# Patient Record
Sex: Female | Born: 1965 | Race: White | Hispanic: No | Smoking: Never smoker
Health system: Southern US, Community
[De-identification: ages and names within clinical notes are randomized; demographics above are authoritative.]

## PROBLEM LIST (undated history)

## (undated) DIAGNOSIS — N920 Excessive and frequent menstruation with regular cycle: Secondary | ICD-10-CM

## (undated) DIAGNOSIS — D171 Benign lipomatous neoplasm of skin and subcutaneous tissue of trunk: Secondary | ICD-10-CM

## (undated) DIAGNOSIS — R011 Cardiac murmur, unspecified: Secondary | ICD-10-CM

## (undated) DIAGNOSIS — R809 Proteinuria, unspecified: Secondary | ICD-10-CM

## (undated) DIAGNOSIS — D649 Anemia, unspecified: Secondary | ICD-10-CM

## (undated) DIAGNOSIS — B001 Herpesviral vesicular dermatitis: Secondary | ICD-10-CM

## (undated) DIAGNOSIS — I4949 Other premature depolarization: Secondary | ICD-10-CM

## (undated) DIAGNOSIS — E785 Hyperlipidemia, unspecified: Secondary | ICD-10-CM

## (undated) DIAGNOSIS — E559 Vitamin D deficiency, unspecified: Secondary | ICD-10-CM

## (undated) DIAGNOSIS — E119 Type 2 diabetes mellitus without complications: Secondary | ICD-10-CM

## (undated) DIAGNOSIS — J45909 Unspecified asthma, uncomplicated: Secondary | ICD-10-CM

## (undated) DIAGNOSIS — T7840XA Allergy, unspecified, initial encounter: Secondary | ICD-10-CM

## (undated) DIAGNOSIS — I428 Other cardiomyopathies: Secondary | ICD-10-CM

## (undated) HISTORY — PX: NASAL POLYP SURGERY: SHX186

## (undated) HISTORY — DX: Proteinuria, unspecified: R80.9

## (undated) HISTORY — DX: Hyperlipidemia, unspecified: E78.5

## (undated) HISTORY — DX: Benign lipomatous neoplasm of skin and subcutaneous tissue of trunk: D17.1

## (undated) HISTORY — DX: Anemia, unspecified: D64.9

## (undated) HISTORY — DX: Vitamin D deficiency, unspecified: E55.9

## (undated) HISTORY — DX: Herpesviral vesicular dermatitis: B00.1

## (undated) HISTORY — DX: Unspecified asthma, uncomplicated: J45.909

## (undated) HISTORY — PX: WISDOM TOOTH EXTRACTION: SHX21

## (undated) HISTORY — DX: Other premature depolarization: I49.49

## (undated) HISTORY — DX: Other cardiomyopathies: I42.8

## (undated) HISTORY — DX: Cardiac murmur, unspecified: R01.1

## (undated) HISTORY — DX: Excessive and frequent menstruation with regular cycle: N92.0

## (undated) HISTORY — DX: Type 2 diabetes mellitus without complications: E11.9

## (undated) HISTORY — DX: Allergy, unspecified, initial encounter: T78.40XA

---

## 1992-05-16 HISTORY — PX: OTHER SURGICAL HISTORY: SHX169

## 1992-05-16 HISTORY — PX: NASAL POLYP SURGERY: SHX186

## 2004-06-20 ENCOUNTER — Emergency Department (HOSPITAL_COMMUNITY): Admission: EM | Admit: 2004-06-20 | Discharge: 2004-06-20 | Payer: Self-pay | Admitting: Emergency Medicine

## 2004-10-11 ENCOUNTER — Emergency Department: Payer: Self-pay | Admitting: Emergency Medicine

## 2004-10-11 ENCOUNTER — Other Ambulatory Visit: Payer: Self-pay

## 2004-10-27 ENCOUNTER — Ambulatory Visit: Payer: Self-pay | Admitting: Internal Medicine

## 2004-12-30 ENCOUNTER — Ambulatory Visit: Payer: Self-pay | Admitting: Endocrinology

## 2006-08-17 IMAGING — CT CT ABDOMEN WO/W CM
2 of 4 series · 14 of 32 positions shown, 19 images · non-contrast
Comparison: none

REASON FOR EXAM: palpitations and heart arrhythmia   eval adrenals  (recent
CXR was normal)
COMMENTS:

[Series 4: with · axial · 0.67mm/px · z∈[-396,-172]mm · 8 of 37 slices shown, 13 images]
[im 5/37  soft-tissue]
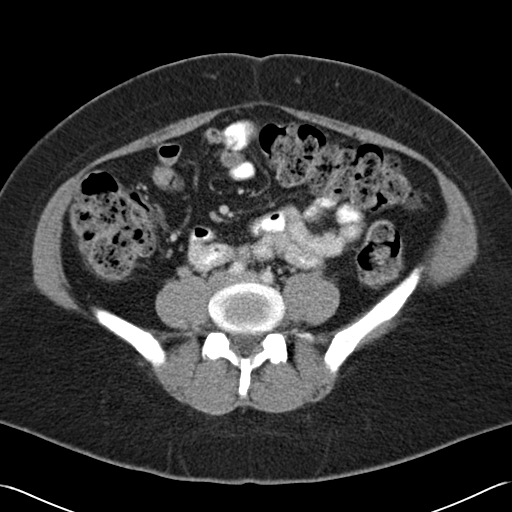
[im 5/37  bone]
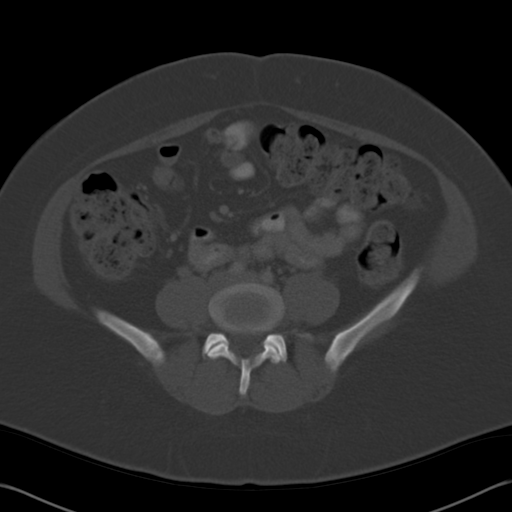
[im 9/37  soft-tissue]
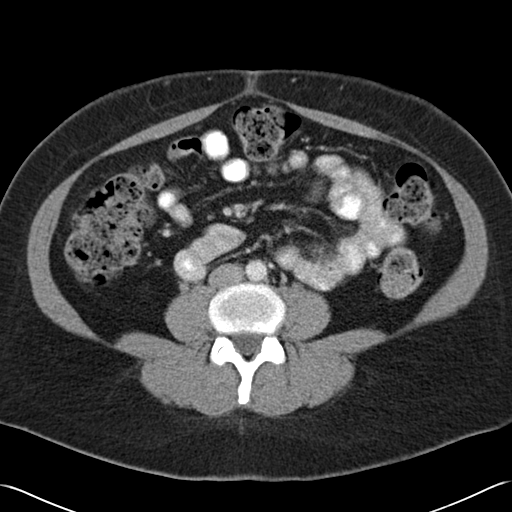
[im 13/37  soft-tissue]
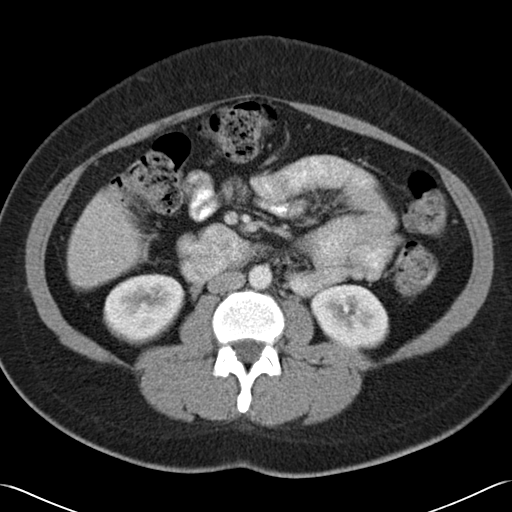
[im 17/37  soft-tissue]
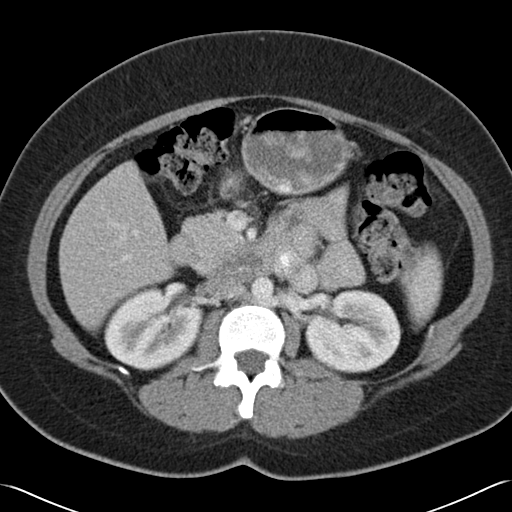
[im 21/37  soft-tissue]
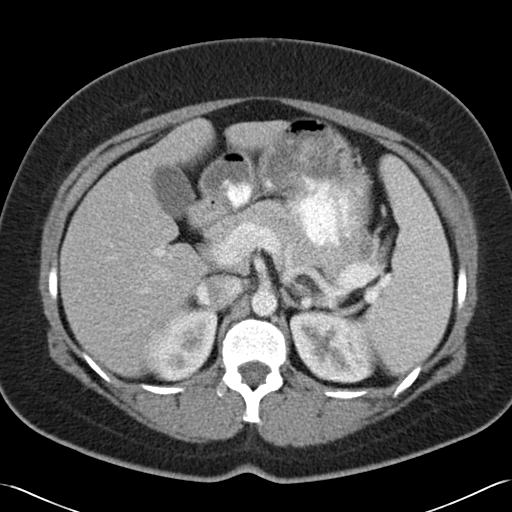
[im 21/37  lung]
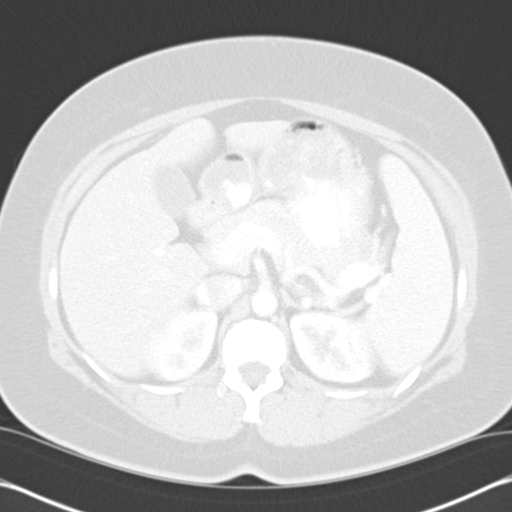
[im 25/37  soft-tissue]
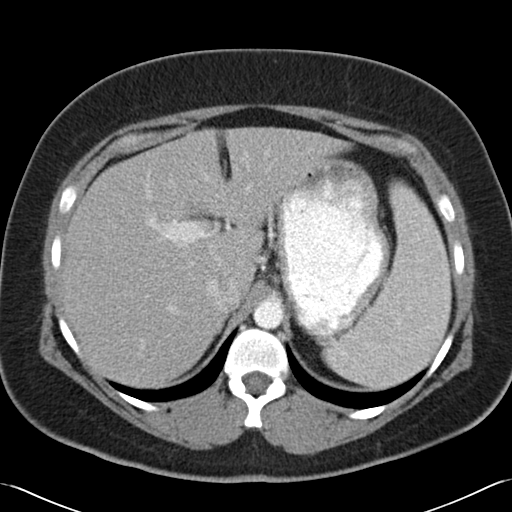
[im 25/37  lung]
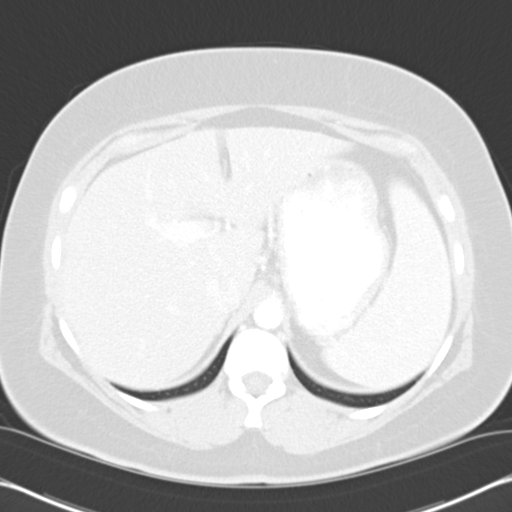
[im 29/37  soft-tissue]
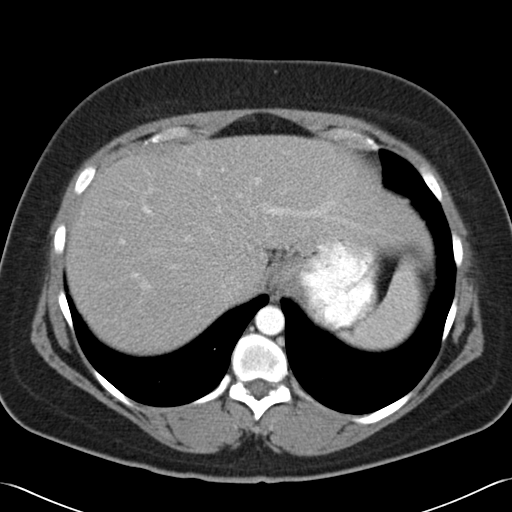
[im 29/37  lung]
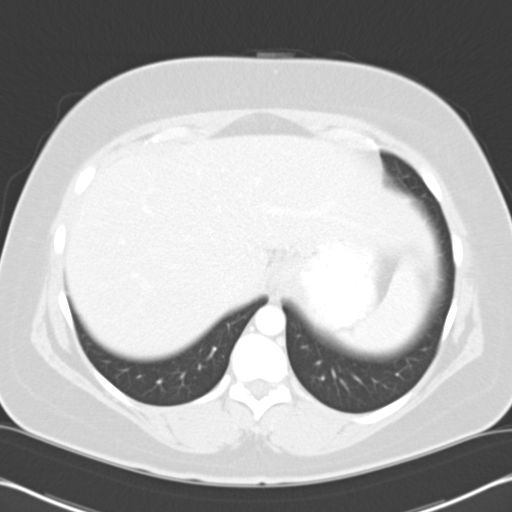
[im 33/37  soft-tissue]
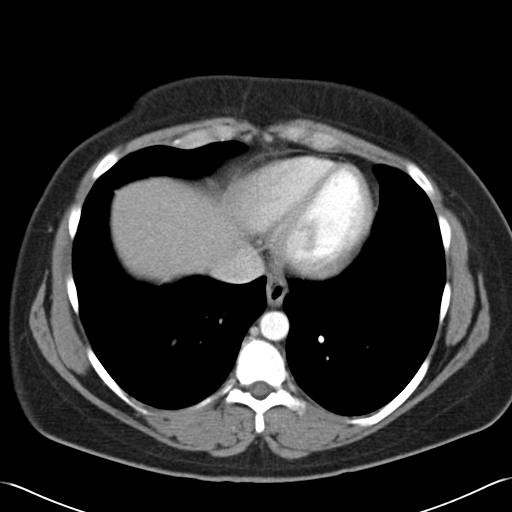
[im 33/37  lung]
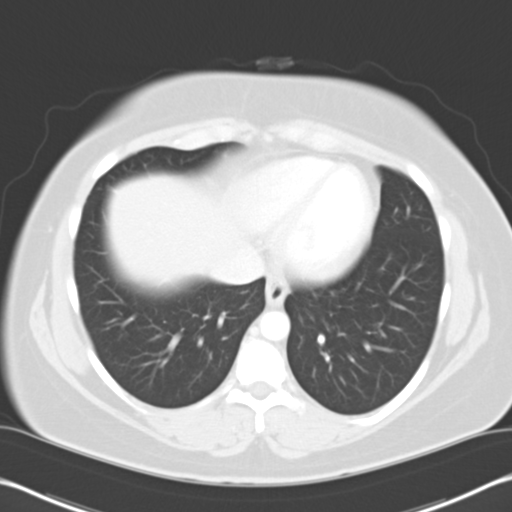

[Series 6: delay · axial · delayed · 0.67mm/px · z∈[-396,-236]mm · 6 of 37 slices shown]
[im 5/37  soft-tissue]
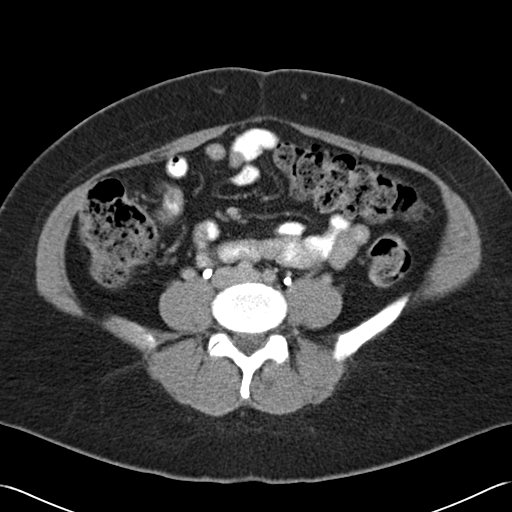
[im 9/37  soft-tissue]
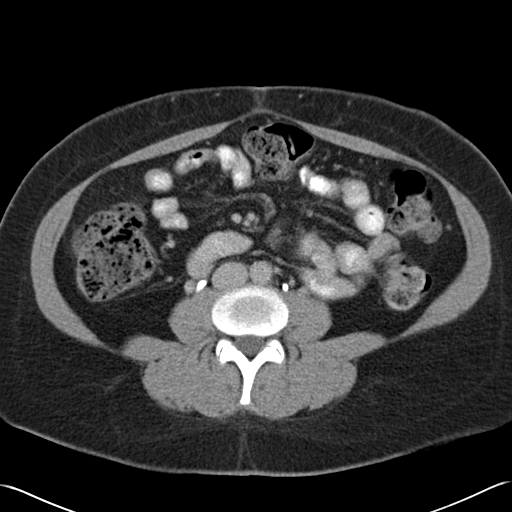
[im 13/37  soft-tissue]
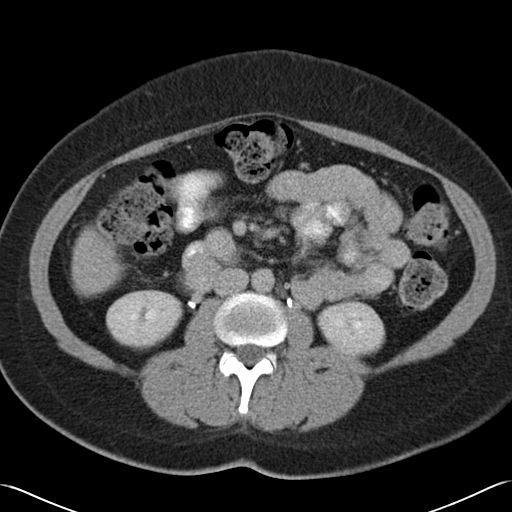
[im 17/37  soft-tissue]
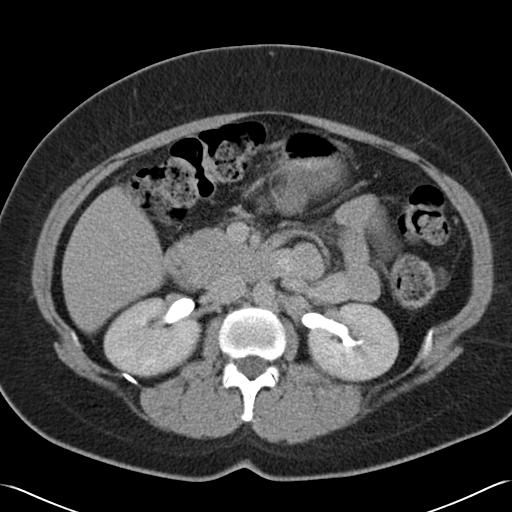
[im 21/37  soft-tissue]
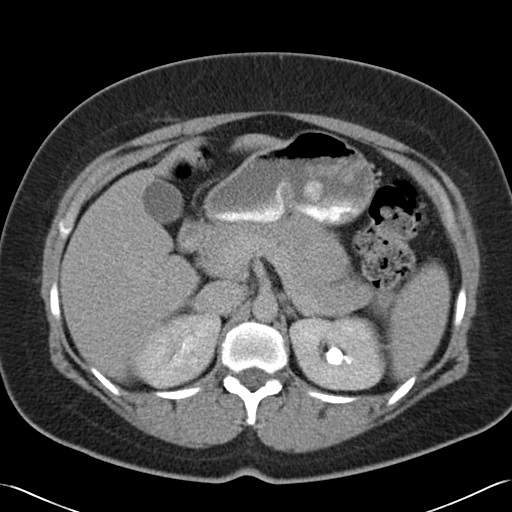
[im 25/37  soft-tissue]
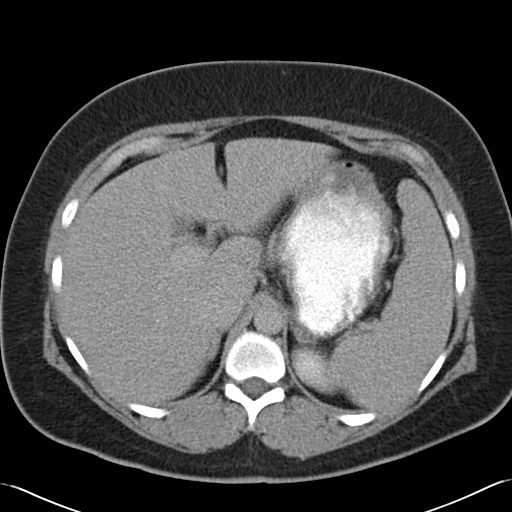

[14 of 32 positions shown; findings below may reference images not displayed]

PROCEDURE:     CT  - CT ABDOMEN STANDARD W/WO  - October 27, 2004  [DATE]

RESULT:        Triphasic CT scan of the abdomen shows a normal appearance of
the liver, spleen, pancreas, kidneys, gallbladder, aorta and adrenal glands.
 No abnormal fluid collections, free fluid or free air are seen.  The
kidneys appear to enhance normally.  No renal calculi are evident on the
precontrast images. No obstructive changes are noted on the delayed images.
No radiopaque gallstones are demonstrated.
IMPRESSION: Unremarkable appearance of the abdomen.   No evidence of a focal adrenal
mass.  No evidence of adenopathy.

## 2006-08-28 DIAGNOSIS — I429 Cardiomyopathy, unspecified: Secondary | ICD-10-CM

## 2006-08-28 DIAGNOSIS — I428 Other cardiomyopathies: Secondary | ICD-10-CM

## 2006-08-28 HISTORY — DX: Other cardiomyopathies: I42.8

## 2006-08-28 HISTORY — DX: Cardiomyopathy, unspecified: I42.9

## 2009-07-29 ENCOUNTER — Ambulatory Visit: Payer: Self-pay | Admitting: Unknown Physician Specialty

## 2010-08-11 ENCOUNTER — Ambulatory Visit: Payer: Self-pay | Admitting: Family Medicine

## 2011-08-25 ENCOUNTER — Ambulatory Visit: Payer: Self-pay | Admitting: Family Medicine

## 2012-06-03 ENCOUNTER — Other Ambulatory Visit: Payer: Self-pay | Admitting: Family Medicine

## 2012-06-03 NOTE — Telephone Encounter (Signed)
Please pull paper chart.  

## 2012-06-04 NOTE — Telephone Encounter (Signed)
No [facility] account. May be previous [place] patient of Dr. Michaelle Copas.

## 2012-08-08 ENCOUNTER — Encounter: Payer: Self-pay | Admitting: Family Medicine

## 2012-08-08 ENCOUNTER — Ambulatory Visit (INDEPENDENT_AMBULATORY_CARE_PROVIDER_SITE_OTHER): Payer: BC Managed Care – PPO | Admitting: Family Medicine

## 2012-08-08 VITALS — BP 118/68 | HR 67 | Temp 98.2°F | Resp 16 | Ht 63.0 in | Wt 160.0 lb

## 2012-08-08 DIAGNOSIS — E7800 Pure hypercholesterolemia, unspecified: Secondary | ICD-10-CM

## 2012-08-08 DIAGNOSIS — E119 Type 2 diabetes mellitus without complications: Secondary | ICD-10-CM | POA: Insufficient documentation

## 2012-08-08 DIAGNOSIS — D509 Iron deficiency anemia, unspecified: Secondary | ICD-10-CM | POA: Insufficient documentation

## 2012-08-08 DIAGNOSIS — I493 Ventricular premature depolarization: Secondary | ICD-10-CM | POA: Insufficient documentation

## 2012-08-08 DIAGNOSIS — E1121 Type 2 diabetes mellitus with diabetic nephropathy: Secondary | ICD-10-CM | POA: Insufficient documentation

## 2012-08-08 DIAGNOSIS — I10 Essential (primary) hypertension: Secondary | ICD-10-CM | POA: Insufficient documentation

## 2012-08-08 DIAGNOSIS — J309 Allergic rhinitis, unspecified: Secondary | ICD-10-CM | POA: Insufficient documentation

## 2012-08-08 DIAGNOSIS — J45909 Unspecified asthma, uncomplicated: Secondary | ICD-10-CM | POA: Insufficient documentation

## 2012-08-08 LAB — CBC WITH DIFFERENTIAL/PLATELET
Basophils Absolute: 0 10*3/uL (ref 0.0–0.1)
Basophils Relative: 1 % (ref 0–1)
Hemoglobin: 9.6 g/dL — ABNORMAL LOW (ref 12.0–15.0)
MCHC: 31.9 g/dL (ref 30.0–36.0)
Neutro Abs: 2.8 10*3/uL (ref 1.7–7.7)
Neutrophils Relative %: 44 % (ref 43–77)
Platelets: 277 10*3/uL (ref 150–400)
RDW: 17.3 % — ABNORMAL HIGH (ref 11.5–15.5)

## 2012-08-08 LAB — COMPREHENSIVE METABOLIC PANEL
ALT: 16 U/L (ref 0–35)
AST: 16 U/L (ref 0–37)
Alkaline Phosphatase: 50 U/L (ref 39–117)
Glucose, Bld: 99 mg/dL (ref 70–99)
Sodium: 137 mEq/L (ref 135–145)
Total Bilirubin: 0.5 mg/dL (ref 0.3–1.2)
Total Protein: 6.9 g/dL (ref 6.0–8.3)

## 2012-08-08 LAB — IRON AND TIBC: UIBC: 376 ug/dL (ref 125–400)

## 2012-08-08 LAB — LIPID PANEL
Cholesterol: 175 mg/dL (ref 0–200)
LDL Cholesterol: 118 mg/dL — ABNORMAL HIGH (ref 0–99)
Total CHOL/HDL Ratio: 4.9 Ratio
VLDL: 21 mg/dL (ref 0–40)

## 2012-08-08 MED ORDER — ALBUTEROL SULFATE HFA 108 (90 BASE) MCG/ACT IN AERS: 2.0000 | INHALATION_SPRAY | Freq: Four times a day (QID) | RESPIRATORY_TRACT | Status: DC | PRN

## 2012-08-08 MED ORDER — PRAVASTATIN SODIUM 40 MG PO TABS: 40.0000 mg | ORAL_TABLET | Freq: Every day | ORAL | Status: DC

## 2012-08-08 MED ORDER — LOSARTAN POTASSIUM 25 MG PO TABS: 25.0000 mg | ORAL_TABLET | Freq: Every day | ORAL | Status: DC

## 2012-08-08 MED ORDER — CARVEDILOL 3.125 MG PO TABS: 3.1250 mg | ORAL_TABLET | Freq: Two times a day (BID) | ORAL | Status: DC

## 2012-08-08 MED ORDER — SITAGLIPTIN PHOS-METFORMIN HCL 50-1000 MG PO TABS: 1.0000 | ORAL_TABLET | Freq: Two times a day (BID) | ORAL | Status: DC

## 2012-08-08 MED ORDER — GLIPIZIDE ER 5 MG PO TB24: 5.0000 mg | ORAL_TABLET | Freq: Every day | ORAL | Status: DC

## 2012-08-08 MED ORDER — FLUTICASONE FUROATE 27.5 MCG/SPRAY NA SUSP: 2.0000 | Freq: Every day | NASAL | Status: DC

## 2012-08-08 NOTE — Assessment & Plan Note (Signed)
Controlled; with fifteen pound weight loss in past three months; congratulations; has suffered with three episodes of hypoglycemia with recent weight loss and dietary modification; obtain labs.

## 2012-08-08 NOTE — Progress Notes (Signed)
[address]   [place], [place]  [address]   [phone]  Subjective:    Patient ID: Barbara Henson, female    DOB: 04/18/1966, 47 y.o.   MRN: [id number]  HPI This 47 y.o. female presents to establish care and for follow-up:  1.  DMII:  Eight month follow-up; exercising 30-46 minutes per day; does before goes to bed.  Has three pound weights; work also has gym [employer].  Also dieting; has lost 15 pounds in past three months.  Multiple deaths in family; cousin died of AMI; going on vacation this summer and plans to walk [place].  Sugars running much better; having lows at night; shaking.  Three episodes in past two months.  Fasting sugars sporadically; running 100-110.  No meds for one week; left medications at home while on work trip.    2. Hyperlipidemia:  Eight month follow-up; no changes to management made at last visit; reports good compliance with medication; good tolerance to medication; good symptom control. Denies HA, dizziness, focal weakness, paresthesias.  3.  Cardiomyopathy with PVCs:  Stable; intermittent issue.  Using extra carvedilol PRN PVCs/palpitations.  Not checking blood pressure at home.  Compliance with medication; good tolerance to medication; good symptom control.  No dizziness or symptoms of low blood pressure.  4.  Anemia iron deficiency: eight month follow-up.  Got Mirena after last visit due to menorrhagia; still having menses but significantly lighter; monthly menses.  After insertion of Mirena, bled for three months straight.  No dizziness.  No fatigue.  Thinks that anemia much improved.  5.  Allergic rhinitis: stable; taking Zyrtec and Veramyst PRN at this time.  No significant rhinorrhea, nasal congestion, itchy eyes or nose.    6.  Asthma:  Must use Albuterol during exercise thus using more frequently than baseline.    Review of Systems  Constitutional: Negative for fever, chills, diaphoresis and fatigue.  Respiratory: Negative for cough, choking, chest  tightness, shortness of breath, wheezing and stridor.   Cardiovascular: Positive for palpitations. Negative for chest pain and leg swelling.  Endocrine: Negative for cold intolerance, heat intolerance, polydipsia, polyphagia and polyuria.  Genitourinary: Positive for menstrual problem.  Skin: Negative for color change, pallor, rash and wound.  Neurological: Negative for dizziness, tremors, seizures, syncope, facial asymmetry, speech difficulty, weakness, light-headedness, numbness and headaches.        Past Medical History  Diagnosis Date  . Asthma   . Allergy   . Anemia   . Diabetes mellitus without complication   . Hyperlipidemia     Past Surgical History  Procedure Laterality Date  . Cesarean section    . Wisdom tooth extraction    . Nasal polypectomy      Prior to Admission medications   Medication Sig Start Date End Date Taking? Authorizing Provider  albuterol (PROVENTIL HFA;VENTOLIN HFA) 108 (90 BASE) MCG/ACT inhaler Inhale 2 puffs into the lungs every 6 (six) hours as needed for wheezing. 08/08/12  Yes Ethelda Chick, MD  carvedilol (COREG) 3.125 MG tablet Take 1 tablet (3.125 mg total) by mouth 2 (two) times daily with a meal. 08/08/12  Yes Ethelda Chick, MD  cetirizine (ZYRTEC) 10 MG tablet Take 10 mg by mouth daily.   Yes Historical Provider, MD  fish oil-omega-3 fatty acids 1000 MG capsule Take 2 g by mouth daily.   Yes Historical Provider, MD  fluticasone (VERAMYST) 27.5 MCG/SPRAY nasal spray Place 2 sprays into the nose daily. 08/08/12  Yes Ethelda Chick,  MD  glipiZIDE (GLUCOTROL XL) 5 MG 24 hr tablet Take 1 tablet (5 mg total) by mouth daily. 08/08/12  Yes Ethelda Chick, MD  losartan (COZAAR) 25 MG tablet Take 1 tablet (25 mg total) by mouth daily. 08/08/12  Yes Ethelda Chick, MD  pravastatin (PRAVACHOL) 40 MG tablet Take 1 tablet (40 mg total) by mouth daily. 08/08/12  Yes Ethelda Chick, MD  sitaGLIPtan-metformin (JANUMET) 50-1000 MG per tablet Take 1 tablet by mouth  2 (two) times daily with a meal. 08/08/12  Yes Ethelda Chick, MD    Allergies  Allergen Reactions  . Celebrex (Celecoxib)   . Lisinopril     History   Social History  . Marital Status: Married    Spouse Name: N/A    Number of Children: N/A  . Years of Education: N/A   Occupational History  . accounting    Social History Main Topics  . Smoking status: Never Smoker   . Smokeless tobacco: Never Used  . Alcohol Use: Yes  . Drug Use: No  . Sexually Active: Yes -- Female partner(s)    Birth Control/ Protection: Other-see comments     Comment: 1 partner   Other Topics Concern  . Not on file   Social History Narrative  . No narrative on file    Family History  Problem Relation Age of Onset  . Adopted: Yes  . Family history unknown: Yes    Objective:   Physical Exam  Nursing note and vitals reviewed. Constitutional: She is oriented to person, place, and time. She appears well-developed and well-nourished. No distress.  HENT:  Mouth/Throat: Oropharynx is clear and moist.  Eyes: Conjunctivae and EOM are normal. Pupils are equal, round, and reactive to light.  Neck: Normal range of motion. Neck supple. No JVD present. No thyromegaly present.  Cardiovascular: Normal rate and regular rhythm.  Exam reveals no gallop and no friction rub.   Murmur heard.  Systolic murmur is present with a grade of 2/6  Pulmonary/Chest: Effort normal and breath sounds normal. No respiratory distress. She has no wheezes. She has no rales.  Abdominal: Soft. Bowel sounds are normal. She exhibits no distension. There is no tenderness. There is no rebound and no guarding.  Lymphadenopathy:    She has no cervical adenopathy.  Neurological: She is alert and oriented to person, place, and time. No cranial nerve deficit. She exhibits normal muscle tone. Coordination normal.  Skin: Skin is warm and dry. No rash noted. She is not diaphoretic. No pallor.  Psychiatric: She has a normal mood and affect. Her  behavior is normal.        Assessment & Plan:  Type II or unspecified type diabetes mellitus without mention of complication, not stated as uncontrolled - Plan: CBC with Differential, Comprehensive metabolic panel, POCT glycosylated hemoglobin (Hb A1C), sitaGLIPtan-metformin (JANUMET) 50-1000 MG per tablet, losartan (COZAAR) 25 MG tablet, glipiZIDE (GLUCOTROL XL) 5 MG 24 hr tablet, carvedilol (COREG) 3.125 MG tablet  Essential hypertension, benign - Plan: CBC with Differential, Comprehensive metabolic panel, CK  Iron deficiency anemia, unspecified - Plan: Iron and TIBC  Pure hypercholesterolemia - Plan: Lipid panel, pravastatin (PRAVACHOL) 40 MG tablet  Allergic rhinitis - Plan: fluticasone (VERAMYST) 27.5 MCG/SPRAY nasal spray  Extrinsic asthma, unspecified - Plan: albuterol (PROVENTIL HFA;VENTOLIN HFA) 108 (90 BASE) MCG/ACT inhaler  Meds ordered this encounter  Medications  . cetirizine (ZYRTEC) 10 MG tablet    Sig: Take 10 mg by mouth daily.  Marland Kitchen DISCONTD:  fluticasone (VERAMYST) 27.5 MCG/SPRAY nasal spray    Sig: Place 2 sprays into the nose daily.  Marland Kitchen DISCONTD: sitaGLIPtan-metformin (JANUMET) 50-1000 MG per tablet    Sig: Take 1 tablet by mouth 2 (two) times daily with a meal.  . DISCONTD: albuterol (PROVENTIL HFA;VENTOLIN HFA) 108 (90 BASE) MCG/ACT inhaler    Sig: Inhale 2 puffs into the lungs every 6 (six) hours as needed for wheezing.  Marland Kitchen DISCONTD: glipiZIDE (GLUCOTROL XL) 5 MG 24 hr tablet    Sig: Take 5 mg by mouth daily.  Marland Kitchen DISCONTD: carvedilol (COREG) 3.125 MG tablet    Sig: Take 3.125 mg by mouth 2 (two) times daily with a meal.  . DISCONTD: losartan (COZAAR) 25 MG tablet    Sig: Take 25 mg by mouth daily.  Marland Kitchen DISCONTD: pravastatin (PRAVACHOL) 40 MG tablet    Sig: Take 40 mg by mouth daily.  . fish oil-omega-3 fatty acids 1000 MG capsule    Sig: Take 2 g by mouth daily.  . sitaGLIPtan-metformin (JANUMET) 50-1000 MG per tablet    Sig: Take 1 tablet by mouth 2 (two) times  daily with a meal.    Dispense:  60 tablet    Refill:  5  . pravastatin (PRAVACHOL) 40 MG tablet    Sig: Take 1 tablet (40 mg total) by mouth daily.    Dispense:  30 tablet    Refill:  5  . losartan (COZAAR) 25 MG tablet    Sig: Take 1 tablet (25 mg total) by mouth daily.    Dispense:  30 tablet    Refill:  5  . glipiZIDE (GLUCOTROL XL) 5 MG 24 hr tablet    Sig: Take 1 tablet (5 mg total) by mouth daily.    Dispense:  30 tablet    Refill:  5  . fluticasone (VERAMYST) 27.5 MCG/SPRAY nasal spray    Sig: Place 2 sprays into the nose daily.    Dispense:  10 g    Refill:  11  . carvedilol (COREG) 3.125 MG tablet    Sig: Take 1 tablet (3.125 mg total) by mouth 2 (two) times daily with a meal.    Dispense:  60 tablet    Refill:  5  . albuterol (PROVENTIL HFA;VENTOLIN HFA) 108 (90 BASE) MCG/ACT inhaler    Sig: Inhale 2 puffs into the lungs every 6 (six) hours as needed for wheezing.    Dispense:  18 g    Refill:  1

## 2012-08-08 NOTE — Assessment & Plan Note (Signed)
Stable; refill provided.

## 2012-08-08 NOTE — Assessment & Plan Note (Signed)
Controlled.  No change in medications; obtain labs; refills provided.

## 2012-08-08 NOTE — Assessment & Plan Note (Signed)
Controlled; refills provided. 

## 2012-08-08 NOTE — Assessment & Plan Note (Signed)
Uncontrolled. Obtain labs; asymptomatic.

## 2012-08-08 NOTE — Assessment & Plan Note (Signed)
Stable; refill of Coreg provided.

## 2012-08-08 NOTE — Patient Instructions (Addendum)
Type II or unspecified type diabetes mellitus without mention of complication, not stated as uncontrolled - Plan: CBC with Differential, Comprehensive metabolic panel, POCT glycosylated hemoglobin (Hb A1C), sitaGLIPtan-metformin (JANUMET) 50-1000 MG per tablet, losartan (COZAAR) 25 MG tablet, glipiZIDE (GLUCOTROL XL) 5 MG 24 hr tablet, carvedilol (COREG) 3.125 MG tablet  Essential hypertension, benign - Plan: CBC with Differential, Comprehensive metabolic panel, CK  Iron deficiency anemia, unspecified - Plan: Iron and TIBC  Pure hypercholesterolemia - Plan: Lipid panel, pravastatin (PRAVACHOL) 40 MG tablet  Allergic rhinitis - Plan: fluticasone (VERAMYST) 27.5 MCG/SPRAY nasal spray  Extrinsic asthma, unspecified - Plan: albuterol (PROVENTIL HFA;VENTOLIN HFA) 108 (90 BASE) MCG/ACT inhaler

## 2012-08-08 NOTE — Assessment & Plan Note (Signed)
Controlled; obtain labs; refills provided. 

## 2012-08-08 NOTE — Assessment & Plan Note (Signed)
Controlled; refill of Albuterol provided.

## 2012-08-16 ENCOUNTER — Encounter: Payer: Self-pay | Admitting: Family Medicine

## 2012-08-28 ENCOUNTER — Ambulatory Visit: Payer: Self-pay | Admitting: Unknown Physician Specialty

## 2012-10-16 ENCOUNTER — Encounter: Payer: Self-pay | Admitting: *Deleted

## 2012-12-04 ENCOUNTER — Encounter: Payer: Self-pay | Admitting: Family Medicine

## 2012-12-04 ENCOUNTER — Ambulatory Visit (INDEPENDENT_AMBULATORY_CARE_PROVIDER_SITE_OTHER): Payer: BC Managed Care – PPO | Admitting: Family Medicine

## 2012-12-04 VITALS — BP 100/60 | HR 63 | Temp 98.6°F | Resp 16 | Ht 62.75 in | Wt 146.0 lb

## 2012-12-04 DIAGNOSIS — I493 Ventricular premature depolarization: Secondary | ICD-10-CM

## 2012-12-04 DIAGNOSIS — D509 Iron deficiency anemia, unspecified: Secondary | ICD-10-CM

## 2012-12-04 DIAGNOSIS — I4949 Other premature depolarization: Secondary | ICD-10-CM

## 2012-12-04 DIAGNOSIS — I10 Essential (primary) hypertension: Secondary | ICD-10-CM

## 2012-12-04 DIAGNOSIS — E7800 Pure hypercholesterolemia, unspecified: Secondary | ICD-10-CM

## 2012-12-04 DIAGNOSIS — E119 Type 2 diabetes mellitus without complications: Secondary | ICD-10-CM

## 2012-12-04 DIAGNOSIS — E1121 Type 2 diabetes mellitus with diabetic nephropathy: Secondary | ICD-10-CM

## 2012-12-04 LAB — CBC WITH DIFFERENTIAL/PLATELET
Basophils Absolute: 0 10*3/uL (ref 0.0–0.1)
Basophils Relative: 0 % (ref 0–1)
Eosinophils Absolute: 0.5 10*3/uL (ref 0.0–0.7)
Eosinophils Relative: 7 % — ABNORMAL HIGH (ref 0–5)
Lymphocytes Relative: 33 % (ref 12–46)
MCHC: 32.6 g/dL (ref 30.0–36.0)
MCV: 72.9 fL — ABNORMAL LOW (ref 78.0–100.0)
Platelets: 269 10*3/uL (ref 150–400)
RDW: 16.5 % — ABNORMAL HIGH (ref 11.5–15.5)
WBC: 7.3 10*3/uL (ref 4.0–10.5)

## 2012-12-04 LAB — POCT URINALYSIS DIPSTICK
Nitrite, UA: NEGATIVE
Spec Grav, UA: 1.025
Urobilinogen, UA: 0.2
pH, UA: 5.5

## 2012-12-04 LAB — LIPID PANEL
HDL: 46 mg/dL (ref >39)
LDL Cholesterol: 81 mg/dL (ref 0–99)
Total CHOL/HDL Ratio: 3.1 Ratio
Triglycerides: 87 mg/dL (ref <150)
VLDL: 17 mg/dL (ref 0–40)

## 2012-12-04 LAB — TSH: TSH: 2.099 u[IU]/mL (ref 0.350–4.500)

## 2012-12-04 LAB — COMPREHENSIVE METABOLIC PANEL
ALT: 12 U/L (ref 0–35)
AST: 13 U/L (ref 0–37)
Alkaline Phosphatase: 63 U/L (ref 39–117)
BUN: 8 mg/dL (ref 6–23)
Chloride: 104 mEq/L (ref 96–112)
Creat: 0.63 mg/dL (ref 0.50–1.10)
Total Bilirubin: 0.6 mg/dL (ref 0.3–1.2)

## 2012-12-04 LAB — CK: Total CK: 54 U/L (ref 7–177)

## 2012-12-04 NOTE — Progress Notes (Signed)
[address]   [place], [place]  [address]   [phone]  Subjective:    Patient ID: Barbara Henson, female    DOB: 1966-04-14, 47 y.o.   MRN: [id number]  HPI This 47 y.o. female presents for four month follow-up:  1.  DMII;  four month follow-up;  Treadmill and bike riding and weights with resistant bands; exercising 30 minutes per day; husband has lost 22 pounds; has met goal; at max 205 non-pregnant state three years ago.  Loose skin in lower abdomen.  Sugars running really good; 80s; continues to have lows.  Worried that blood pressure running low; bending over will cause dizziness.  Still taking Glipizide.    2.  Hyperlipidemia:  Four month follow-up; cholesterol levels slightly elevated at last visit; no changes to management made; weight down 14 pounds from last visit; reports compliance with medication, good tolerance to medication, good symptom control.  Denies CP/SOB/leg swelling/HA/focal weakness/paresthesias.  3. Anemia:  Mirena did not take away menses; went to the beach in April; had just had menses before beach trip; Mirena one year; last month had 8 day period;; heavy.  Frustrated by persistent menses.  4.  PVC: taking Coreg bid.  When first started exercising, really suffered with PVCs; now that has been exercising for six months, PVCs during exercise have resolved.   Review of Systems  Constitutional: Negative for fever, chills, diaphoresis and fatigue.  Respiratory: Negative for shortness of breath, wheezing and stridor.   Cardiovascular: Negative for chest pain, palpitations and leg swelling.  Gastrointestinal: Negative for nausea, vomiting, abdominal pain and diarrhea.  Genitourinary: Positive for menstrual problem.  Skin: Negative for color change, pallor, rash and wound.  Neurological: Positive for dizziness and light-headedness. Negative for tremors, seizures, syncope, facial asymmetry, speech difficulty, weakness, numbness and headaches.    Past Medical History    Diagnosis Date  . Asthma   . Allergy   . Anemia   . Diabetes mellitus without complication   . Hyperlipidemia   . Lipoma of abdominal wall   . Unspecified vitamin D deficiency   . Microalbuminuria   . Herpes labialis   . Cardiomyopathy, primary 08/28/2006    released from cardiology 2011  . Premature beats   . Excessive menstruation     Past Surgical History  Procedure Laterality Date  . Cesarean section  x 3    1998/2002/2004  . Wisdom tooth extraction    . Nasal polypectomy  1994  . Nasal polyp surgery      Prior to Admission medications   Medication Sig Start Date End Date Taking? Authorizing Provider  albuterol (PROVENTIL HFA;VENTOLIN HFA) 108 (90 BASE) MCG/ACT inhaler Inhale 2 puffs into the lungs every 6 (six) hours as needed for wheezing. 08/08/12  Yes Ethelda Chick, MD  carvedilol (COREG) 3.125 MG tablet Take 1 tablet (3.125 mg total) by mouth 2 (two) times daily with a meal. 08/08/12  Yes Ethelda Chick, MD  cetirizine (ZYRTEC) 10 MG tablet Take 10 mg by mouth daily.   Yes Historical Provider, MD  cholecalciferol (VITAMIN D) 1000 UNITS tablet Take 1,000 Units by mouth daily.   Yes Historical Provider, MD  ferrous sulfate (KP FERROUS SULFATE) 325 (65 FE) MG tablet Take 325 mg by mouth daily with breakfast.   Yes Historical Provider, MD  fish oil-omega-3 fatty acids 1000 MG capsule Take 2 g by mouth daily.   Yes Historical Provider, MD  fluticasone (FLONASE) 50 MCG/ACT nasal spray Place 2 sprays into  the nose daily.   Yes Historical Provider, MD  glipiZIDE (GLUCOTROL XL) 5 MG 24 hr tablet Take 1 tablet (5 mg total) by mouth daily. 08/08/12  Yes Ethelda Chick, MD  losartan (COZAAR) 25 MG tablet Take 1 tablet (25 mg total) by mouth daily. 08/08/12  Yes Ethelda Chick, MD  pravastatin (PRAVACHOL) 40 MG tablet Take 1 tablet (40 mg total) by mouth daily. 08/08/12  Yes Ethelda Chick, MD  sitaGLIPtan-metformin (JANUMET) 50-1000 MG per tablet Take 1 tablet by mouth 2 (two) times  daily with a meal. 08/08/12  Yes Ethelda Chick, MD  fluticasone (VERAMYST) 27.5 MCG/SPRAY nasal spray Place 2 sprays into the nose daily. 08/08/12   Ethelda Chick, MD    Allergies  Allergen Reactions  . Celebrex (Celecoxib)   . Lisinopril     History   Social History  . Marital Status: Married    Spouse Name: N/A    Number of Children: 5  . Years of Education: college   Occupational History  . accounting    Social History Main Topics  . Smoking status: Never Smoker   . Smokeless tobacco: Never Used  . Alcohol Use: Yes     Comment: moderate  . Drug Use: No  . Sexually Active: Yes -- Female partner(s)    Birth Control/ Protection: Other-see comments     Comment: 1 partner   Other Topics Concern  . Not on file   Social History Narrative   Patient was adopted; Family History unknown.   Exercise: Light;2 x week, cycling; her and her husband got bikes for christmas.    Family History  Problem Relation Age of Onset  . Adopted: Yes  . Allergies Daughter   . Allergies Son        Objective:   Physical Exam  Nursing note and vitals reviewed. Constitutional: She is oriented to person, place, and time. She appears well-developed and well-nourished. No distress.  HENT:  Head: Normocephalic and atraumatic.  Eyes: Conjunctivae and EOM are normal. Pupils are equal, round, and reactive to light.  Neck: Normal range of motion. Neck supple. No thyromegaly present.  Cardiovascular: Normal rate, regular rhythm and intact distal pulses.   Murmur heard.  Systolic murmur is present with a grade of 2/6  Pulmonary/Chest: Effort normal and breath sounds normal. She has no wheezes. She has no rales.  Abdominal: Soft. Bowel sounds are normal. She exhibits no distension and no mass. There is no tenderness. There is no rebound and no guarding.  Lymphadenopathy:    She has no cervical adenopathy.  Neurological: She is alert and oriented to person, place, and time. No cranial nerve deficit.  She exhibits normal muscle tone. Coordination normal.  Skin: Skin is warm and dry. No rash noted. She is not diaphoretic. No erythema.  Psychiatric: She has a normal mood and affect. Her behavior is normal.       Assessment & Plan:  Type II or unspecified type diabetes mellitus without mention of complication, not stated as uncontrolled - Plan: POCT glycosylated hemoglobin (Hb A1C), CK, Microalbumin, urine, TSH  Essential hypertension, benign - Plan: CBC with Differential, Comprehensive metabolic panel, CK, POCT urinalysis dipstick, TSH  Pure hypercholesterolemia - Plan: Lipid panel, CK  Iron deficiency anemia, unspecified  Microalbuminuric diabetic nephropathy  PVC (premature ventricular contraction)   1.  DMII: controlled; fourteen additional pounds of weight loss; likely can d/c Glipizide.  Obtain labs, urine microalbumin. 2.   Hyperlipidemia: moderately controlled; obtain  labs; hope to decrease dose.  Obtain labs.  No change to management. 3.  Iron deficiency anemia: persistent; secondary to menorrhagia; continue IUD; followed by gynecology.  4.  Diabetic nephropathy:  Stable; obtain microalbumin; blood pressure borderline low; would like to d/c Losartan if possible. 5.  PVCs; stable; less symptomatic with exercise.  No change in therapy.

## 2012-12-06 ENCOUNTER — Encounter: Payer: Self-pay | Admitting: Family Medicine

## 2012-12-06 NOTE — Telephone Encounter (Signed)
Can you change last name to Gaspar Garbe then forward to Dr Katrinka Blazing to advise on the message.

## 2012-12-11 ENCOUNTER — Encounter: Payer: Self-pay | Admitting: Family Medicine

## 2012-12-12 ENCOUNTER — Encounter: Payer: Self-pay | Admitting: Family Medicine

## 2013-03-03 ENCOUNTER — Other Ambulatory Visit: Payer: Self-pay | Admitting: Family Medicine

## 2013-03-06 ENCOUNTER — Other Ambulatory Visit: Payer: Self-pay | Admitting: Family Medicine

## 2013-03-21 ENCOUNTER — Other Ambulatory Visit: Payer: Self-pay

## 2013-04-25 ENCOUNTER — Telehealth: Payer: Self-pay | Admitting: *Deleted

## 2013-04-25 NOTE — Telephone Encounter (Signed)
Spoke to patient in regards to faxed lab results from [facility] (sent to be scanned to this chart) Patient will call back after the holidays to schedule an appt. Due to her schedule and busy holiday season she does not have time to make an appt. She will call in January.

## 2013-04-25 NOTE — Telephone Encounter (Signed)
Noted  

## 2013-05-05 ENCOUNTER — Other Ambulatory Visit: Payer: Self-pay | Admitting: Physician Assistant

## 2013-05-10 ENCOUNTER — Other Ambulatory Visit: Payer: Self-pay | Admitting: Physician Assistant

## 2013-05-10 NOTE — Telephone Encounter (Signed)
On 02/25/13 the patient was given #30, RF x 4.  Shouldn't need RF.  However, was also to return for re-evaluation of HTN, DM, etc in 03/2013.  What's the plan?

## 2013-06-02 ENCOUNTER — Other Ambulatory Visit: Payer: Self-pay | Admitting: Physician Assistant

## 2013-06-13 ENCOUNTER — Other Ambulatory Visit: Payer: Self-pay | Admitting: Physician Assistant

## 2013-06-13 ENCOUNTER — Other Ambulatory Visit: Payer: Self-pay | Admitting: Family Medicine

## 2013-06-17 ENCOUNTER — Telehealth: Payer: Self-pay

## 2013-06-17 MED ORDER — CARVEDILOL 3.125 MG PO TABS: ORAL_TABLET | ORAL | Status: DC

## 2013-06-17 NOTE — Telephone Encounter (Signed)
[facility] called to req RF of carvedilol, and stated that the last Rx was changed from 4 RFs to only 1 and there are none remaining. Gave order for 1 RF w/note that pt needs ov for more.

## 2013-07-02 ENCOUNTER — Ambulatory Visit: Payer: BC Managed Care – PPO | Admitting: Family Medicine

## 2013-07-08 ENCOUNTER — Encounter: Payer: Self-pay | Admitting: Family Medicine

## 2013-07-08 ENCOUNTER — Ambulatory Visit (INDEPENDENT_AMBULATORY_CARE_PROVIDER_SITE_OTHER): Payer: BC Managed Care – PPO | Admitting: Family Medicine

## 2013-07-08 VITALS — BP 94/58 | HR 61 | Temp 98.3°F | Resp 18 | Wt 146.0 lb

## 2013-07-08 DIAGNOSIS — E1121 Type 2 diabetes mellitus with diabetic nephropathy: Secondary | ICD-10-CM

## 2013-07-08 DIAGNOSIS — J309 Allergic rhinitis, unspecified: Secondary | ICD-10-CM

## 2013-07-08 DIAGNOSIS — E1129 Type 2 diabetes mellitus with other diabetic kidney complication: Secondary | ICD-10-CM

## 2013-07-08 DIAGNOSIS — E119 Type 2 diabetes mellitus without complications: Secondary | ICD-10-CM

## 2013-07-08 DIAGNOSIS — N058 Unspecified nephritic syndrome with other morphologic changes: Secondary | ICD-10-CM

## 2013-07-08 DIAGNOSIS — I493 Ventricular premature depolarization: Secondary | ICD-10-CM

## 2013-07-08 DIAGNOSIS — D509 Iron deficiency anemia, unspecified: Secondary | ICD-10-CM

## 2013-07-08 DIAGNOSIS — E7800 Pure hypercholesterolemia, unspecified: Secondary | ICD-10-CM

## 2013-07-08 DIAGNOSIS — J45909 Unspecified asthma, uncomplicated: Secondary | ICD-10-CM

## 2013-07-08 MED ORDER — CARVEDILOL 3.125 MG PO TABS: ORAL_TABLET | ORAL | Status: DC

## 2013-07-08 MED ORDER — SITAGLIPTIN PHOS-METFORMIN HCL 50-1000 MG PO TABS: 1.0000 | ORAL_TABLET | Freq: Two times a day (BID) | ORAL | Status: DC

## 2013-07-08 MED ORDER — ALBUTEROL SULFATE HFA 108 (90 BASE) MCG/ACT IN AERS: 2.0000 | INHALATION_SPRAY | Freq: Four times a day (QID) | RESPIRATORY_TRACT | Status: DC | PRN

## 2013-07-08 MED ORDER — PRAVASTATIN SODIUM 40 MG PO TABS: 40.0000 mg | ORAL_TABLET | Freq: Every day | ORAL | Status: DC

## 2013-07-08 NOTE — Progress Notes (Signed)
Subjective:    Patient ID: Barbara Henson, female    DOB: [birth year], 48 y.o.   MRN: [id number]  07/08/2013  Diabetes, Hyperlipidemia and Anemia   HPI This 48 y.o. female presents for eight month follow-up:  1. Menorrhagia:persistent issue for patient; having monthly menses for seven days despite Mirena IUD.  Heavy bleeding for 3-4 days each month.  Called gynecologist recently due to poor response to Mirena IUD.  S/p biopsy last week due to thickened endometrium detected on pelvic u/s; endometrium was 71mm despite monthly menses.  Negative biopsy.  No fibroids; no cervical polyps; no atypia.  Gynecologist suggested hysterectomy versus ablation; pt has not decided yet.    2.  Anemia:  Hgb repeat this month 8.8.  Was SOB with exercise for a while but this has improved.  Taking iron Slow Fe one tablet 3-4 times per week. Denies CP or leg swelling.   Denies bloody or black stools.  3.  DMII:  Poor compliance with diabetic diet over the holidays; HgbA1c this month of 6.5; no longer having low sugars since stopping Glipizide at last visit; weight is still down despite the holidays; not exercising as much due to cold weather.    4. Health maintenance: August 29, 2013 scheduled for mammogram.  Pap smear due with gynecologist.  S/p flu vaccine at work.  5. PVCs: persistent and slightly worse with anemia.  Not checking BP regularly at home; sometimes suffers with dizziness upon standing.  BP borderline low at last visit eight months ago.  6. Hyperlipidemia: eight month follow-up; no changes to management made at last visit; reports good compliance with medication; good tolerance to medication; good symptom control.  Denies HA, dizziness, focal weakness, paresthesias.     Review of Systems  Constitutional: Negative for fever, chills, diaphoresis and fatigue.  Respiratory: Negative for cough, shortness of breath, wheezing and stridor.   Cardiovascular: Negative for chest pain, palpitations and leg  swelling.  Endocrine: Negative for polydipsia, polyphagia and polyuria.  Genitourinary: Positive for menstrual problem.  Skin: Negative for color change, pallor, rash and wound.  Neurological: Negative for dizziness, syncope, facial asymmetry, weakness, light-headedness, numbness and headaches.  Hematological: Negative for adenopathy. Does not bruise/bleed easily.    Past Medical History  Diagnosis Date  . Asthma   . Allergy   . Anemia   . Diabetes mellitus without complication   . Hyperlipidemia   . Lipoma of abdominal wall   . Unspecified vitamin D deficiency   . Microalbuminuria   . Herpes labialis   . Cardiomyopathy, primary 08/28/2006    released from cardiology 2011  . Premature beats   . Excessive menstruation    Allergies  Allergen Reactions  . Celebrex [Celecoxib]   . Lisinopril    Current Outpatient Prescriptions  Medication Sig Dispense Refill  . albuterol (PROVENTIL HFA;VENTOLIN HFA) 108 (90 BASE) MCG/ACT inhaler Inhale 2 puffs into the lungs every 6 (six) hours as needed for wheezing.  18 g  1  . carvedilol (COREG) 3.125 MG tablet Take one tablet by mouth twice a day with meals.  60 tablet  5  . cetirizine (ZYRTEC) 10 MG tablet Take 10 mg by mouth daily.      . cholecalciferol (VITAMIN D) 1000 UNITS tablet Take 1,000 Units by mouth daily.      . ferrous sulfate (KP FERROUS SULFATE) 325 (65 FE) MG tablet Take 325 mg by mouth daily with breakfast.      . fish oil-omega-3 fatty acids 1000  MG capsule Take 2 g by mouth daily.      . fluticasone (FLONASE) 50 MCG/ACT nasal spray Place 2 sprays into the nose daily.      . fluticasone (VERAMYST) 27.5 MCG/SPRAY nasal spray Place 2 sprays into the nose daily.  10 g  11  . losartan (COZAAR) 25 MG tablet Take 1 tablet (25 mg total) by mouth daily. PATIENT NEEDS OFFICE VISIT FOR ADDITIONAL REFILLS  30 tablet  0  . pravastatin (PRAVACHOL) 40 MG tablet Take 1 tablet (40 mg total) by mouth daily.  30 tablet  5  .  sitaGLIPtin-metformin (JANUMET) 50-1000 MG per tablet Take 1 tablet by mouth 2 (two) times daily.  60 tablet  5   No current facility-administered medications for this visit.   History   Social History  . Marital Status: Married    Spouse Name: N/A    Number of Children: 53  . Years of Education: college   Occupational History  . accounting    Social History Main Topics  . Smoking status: Never Smoker   . Smokeless tobacco: Never Used  . Alcohol Use: Yes     Comment: moderate  . Drug Use: No  . Sexual Activity: Yes    Partners: Male    Birth Control/ Protection: Other-see comments     Comment: 1 partner   Other Topics Concern  . Not on file   Social History Narrative   Patient was adopted; Family History unknown.   Exercise: Light;2 x week, cycling; her and her husband got bikes for christmas.       Objective:    BP 94/58  Pulse 61  Temp(Src) 98.3 F (36.8 C) (Oral)  Resp 18  Wt 146 lb (66.225 kg)  SpO2 99%  LMP 07/01/2013 Physical Exam  Nursing note and vitals reviewed. Constitutional: She is oriented to person, place, and time. She appears well-developed and well-nourished. No distress.  HENT:  Head: Normocephalic and atraumatic.  Eyes: Conjunctivae and EOM are normal. Pupils are equal, round, and reactive to light.  Neck: Normal range of motion. Neck supple. No JVD present. Carotid bruit is not present. No thyromegaly present.  Cardiovascular: Normal rate, regular rhythm, normal heart sounds and intact distal pulses.  Exam reveals no gallop and no friction rub.   No murmur heard. Pulmonary/Chest: Effort normal and breath sounds normal. She has no wheezes. She has no rales.  Abdominal: Soft. Bowel sounds are normal. She exhibits no distension and no mass. There is no tenderness. There is no rebound and no guarding.  Lymphadenopathy:    She has no cervical adenopathy.  Neurological: She is alert and oriented to person, place, and time.  Skin: Skin is warm  and dry. No rash noted. She is not diaphoretic. No erythema. No pallor.  Psychiatric: She has a normal mood and affect. Her behavior is normal.        Assessment & Plan:  Iron deficiency anemia, unspecified - Worsening due to persistently heavy menses with Mirena IUD.  S/p gynecological evaluation in past three months including pelvic US, endometrial biopsy.  Considering hysterectomy versus ablation.  Will refer to hematology due to intolerance to oral iron.  Candidate for iron infusion while undergoing gynecological work up.  Hemoccults negative in past two years while addressing anemia issues.  Plan: Ambulatory referral to Hematology.  Type II or unspecified type diabetes mellitus without mention of complication, not stated as uncontrolled - Controlled; no change in management; recent HgbA1c of 6.5.  Pure hypercholesterolemia -  Controlled; no change in management; refills provided.  Extrinsic asthma, unspecified - Controlled; refill of Albuterol provided.  Plan: albuterol (PROVENTIL HFA;VENTOLIN HFA) 108 (90 BASE) MCG/ACT inhaler  Allergic rhinitis: controlled; no change in management.  Microalbuminuric diabetic nephropathy: stable yet BP borderline low; thus, will stop Losartan at this time to avoid hypotension.  PVC (premature ventricular contraction): persistent; continue Coreg at this time.    Meds ordered this encounter  Medications  . albuterol (PROVENTIL HFA;VENTOLIN HFA) 108 (90 BASE) MCG/ACT inhaler    Sig: Inhale 2 puffs into the lungs every 6 (six) hours as needed for wheezing.    Dispense:  18 g    Refill:  1  . carvedilol (COREG) 3.125 MG tablet    Sig: Take one tablet by mouth twice a day with meals.    Dispense:  60 tablet    Refill:  5  . pravastatin (PRAVACHOL) 40 MG tablet    Sig: Take 1 tablet (40 mg total) by mouth daily.    Dispense:  30 tablet    Refill:  5  . sitaGLIPtin-metformin (JANUMET) 50-1000 MG per tablet    Sig: Take 1 tablet by mouth 2 (two)  times daily.    Dispense:  60 tablet    Refill:  5    Return in about 4 months (around 11/05/2013).

## 2013-07-19 ENCOUNTER — Telehealth: Payer: Self-pay

## 2013-07-19 NOTE — Telephone Encounter (Signed)
Pt called back and stated that she had been seeing Dr Rockey Situ, but that she hasn't seen him in 1-2 years d/t him releasing her to f/ups w/Dr Tamala Julian. Advised pt that Dr Tamala Julian would like her to get surgical clearance from him. I called Dr Donivan Scull office to see if pt needs referral and was advised that pt can just call and sch appt herself. Notified pt of this and she agreed. Faxed this info to [place] w/Dr Gollan's contact info. Dr Tamala Julian, [place]

## 2013-07-19 NOTE — Telephone Encounter (Signed)
LMOM for CB. Dr Tamala Julian received fax for med clearance for hysterectomy. Dr Tamala Julian needs for her cardiologist to clear for her cardiomyopathy. Who is her cardiologist?

## 2013-07-22 ENCOUNTER — Ambulatory Visit: Payer: Self-pay | Admitting: Internal Medicine

## 2013-07-25 LAB — CBC CANCER CENTER
BASOS PCT: 0.9 %
Basophil #: 0.1 x10 3/mm (ref 0.0–0.1)
EOS PCT: 7.5 %
Eosinophil #: 0.5 x10 3/mm (ref 0.0–0.7)
HCT: 26.6 % — AB (ref 35.0–47.0)
HGB: 8.1 g/dL — ABNORMAL LOW (ref 12.0–16.0)
Lymphocyte #: 2.5 x10 3/mm (ref 1.0–3.6)
Lymphocyte %: 35.3 %
MCH: 20.9 pg — ABNORMAL LOW (ref 26.0–34.0)
MCHC: 30.4 g/dL — AB (ref 32.0–36.0)
MCV: 69 fL — ABNORMAL LOW (ref 80–100)
MONOS PCT: 7.6 %
Monocyte #: 0.5 x10 3/mm (ref 0.2–0.9)
NEUTROS PCT: 48.7 %
Neutrophil #: 3.5 x10 3/mm (ref 1.4–6.5)
PLATELETS: 260 x10 3/mm (ref 150–440)
RBC: 3.88 10*6/uL (ref 3.80–5.20)
RDW: 17 % — ABNORMAL HIGH (ref 11.5–14.5)
WBC: 7.2 x10 3/mm (ref 3.6–11.0)

## 2013-07-25 LAB — IRON AND TIBC
Iron Bind.Cap.(Total): 404 ug/dL (ref 250–450)
Iron Saturation: 3 %
Iron: 14 ug/dL — ABNORMAL LOW (ref 50–170)
Unbound Iron-Bind.Cap.: 390 ug/dL

## 2013-07-25 LAB — FOLATE: Folic Acid: 33.2 ng/mL (ref 3.1–100.0)

## 2013-08-01 ENCOUNTER — Encounter: Payer: Self-pay | Admitting: Cardiovascular Disease

## 2013-08-01 ENCOUNTER — Ambulatory Visit (INDEPENDENT_AMBULATORY_CARE_PROVIDER_SITE_OTHER): Payer: BC Managed Care – PPO | Admitting: Cardiovascular Disease

## 2013-08-01 VITALS — BP 106/68 | HR 66 | Ht 63.0 in | Wt 152.5 lb

## 2013-08-01 DIAGNOSIS — D509 Iron deficiency anemia, unspecified: Secondary | ICD-10-CM

## 2013-08-01 DIAGNOSIS — Z01810 Encounter for preprocedural cardiovascular examination: Secondary | ICD-10-CM | POA: Insufficient documentation

## 2013-08-01 DIAGNOSIS — Z01818 Encounter for other preprocedural examination: Secondary | ICD-10-CM

## 2013-08-01 DIAGNOSIS — E7800 Pure hypercholesterolemia, unspecified: Secondary | ICD-10-CM

## 2013-08-01 DIAGNOSIS — I4949 Other premature depolarization: Secondary | ICD-10-CM

## 2013-08-01 DIAGNOSIS — I493 Ventricular premature depolarization: Secondary | ICD-10-CM

## 2013-08-01 DIAGNOSIS — E119 Type 2 diabetes mellitus without complications: Secondary | ICD-10-CM

## 2013-08-01 DIAGNOSIS — I10 Essential (primary) hypertension: Secondary | ICD-10-CM

## 2013-08-01 NOTE — Assessment & Plan Note (Signed)
Reasonably well controlled diabetes. Minor indiscretions over the winter 2014

## 2013-08-01 NOTE — Assessment & Plan Note (Signed)
She recently had an iron infusion in preparation for the surgery beginning of may 2015

## 2013-08-01 NOTE — Assessment & Plan Note (Addendum)
We have requested prior records from 2008 up to 2011 from [facility]. She was seen by myself in the office at that time. Notes indicate that she had no active chronic issues that needed to be addressed in 2011. As she is doing very well with no new active issues, no significant symptoms from her PVCs which have been well-controlled on carvedilol, no further testing has been ordered. No further studies indicated prior to hysterectomy at the beginning of April 2015. We will review the prior records to ensure that nothing further needs to be done when these become available. Given her current presentation, she would be acceptable risk for upcoming surgery.  We have suggested in the perioperative period that she continue on her Coreg. We would agree with recent iron infusion in an effort to maximize her blood count prior to the procedure. Certainly if she has any issues in the perioperative period, we would be happy to provide immediate assistance.

## 2013-08-01 NOTE — Patient Instructions (Signed)
You are doing well. No medication changes were made.  We will request records from [facility]  Please call us if you have new issues that need to be addressed before your next appt.

## 2013-08-01 NOTE — Assessment & Plan Note (Signed)
Cholesterol is at goal on the current lipid regimen. No changes to the medications were made.  

## 2013-08-01 NOTE — Progress Notes (Signed)
Patient ID: Barbara Henson, female    DOB: Oct 04, 1965, 48 y.o.   MRN: [id number]  HPI Comments: Barbara Henson is a very pleasant 48 yo woman with  Menorrhagia (persistent issue), heavy bleeding for 3-4 days each month, S/p biopsy for thickened endometrium detected on pelvic u/s; endometrium was 1mm despite monthly menses.  Negative biopsy.  No fibroids; no cervical polyps; no atypia.  Gynecologist suggested hysterectomy versus ablation. She presents today and reports that she is leaning towards hysterectomy and requires preoperative evaluation. She has a history of chronic anemia, and received iron infusions. Diet controlled diabetes. Prior cardiac records unavailable on today's visit. These are in storage with [facility]  In followup today, she reports that she is doing very well. She denies any significant palpitations concerning for PVCs. She reports that she takes her carvedilol, she does not have any symptoms. She recently received an iron infusion in preparation for surgery in May 2015, hysterectomy. She denies any significant shortness of breath, chest pain, leg edema, coughing, PND or orthopnea.  In general hemoglobin A1c has been relatively well-controlled. Some dietary indiscretion through the winter but now she is trying to get back on track She reports hemoglobin levels ranging typically from 7 up to high 8 range  EKG shows normal sinus rhythm with rate 66 beats per minute, no significant ST or T wave changes Total cholesterol 119, LDL 51, HDL 54   Outpatient Encounter Prescriptions as of 08/01/2013  Medication Sig  . albuterol (PROVENTIL HFA;VENTOLIN HFA) 108 (90 BASE) MCG/ACT inhaler Inhale 2 puffs into the lungs every 6 (six) hours as needed for wheezing.  . carvedilol (COREG) 3.125 MG tablet Take one tablet by mouth twice a day with meals.  . cetirizine (ZYRTEC) 10 MG tablet Take 10 mg by mouth daily.  . cholecalciferol (VITAMIN D) 1000 UNITS tablet Take 1,000  Units by mouth daily.  . ferrous sulfate (KP FERROUS SULFATE) 325 (65 FE) MG tablet Take 325 mg by mouth daily with breakfast.  . fish oil-omega-3 fatty acids 1000 MG capsule Take 2 g by mouth daily.  . fluticasone (VERAMYST) 27.5 MCG/SPRAY nasal spray Place 2 sprays into the nose daily.  . pravastatin (PRAVACHOL) 40 MG tablet Take 1 tablet (40 mg total) by mouth daily.  . sitaGLIPtin-metformin (JANUMET) 50-1000 MG per tablet Take 1 tablet by mouth 2 (two) times daily.  . [DISCONTINUED] fluticasone (FLONASE) 50 MCG/ACT nasal spray Place 2 sprays into the nose daily.  . [DISCONTINUED] losartan (COZAAR) 25 MG tablet Take 1 tablet (25 mg total) by mouth daily. PATIENT NEEDS OFFICE VISIT FOR ADDITIONAL REFILLS     Review of Systems  Constitutional: Negative.   HENT: Negative.   Eyes: Negative.   Respiratory: Negative.   Cardiovascular: Negative.   Gastrointestinal: Negative.   Endocrine: Negative.   Musculoskeletal: Negative.   Skin: Negative.   Allergic/Immunologic: Negative.   Neurological: Negative.   Hematological: Negative.   Psychiatric/Behavioral: Negative.   All other systems reviewed and are negative.    BP 106/68  Pulse 66  Ht 5\' 3"  (1.6 m)  Wt 152 lb 8 oz (69.174 kg)  BMI 27.02 kg/m2  LMP 07/01/2013  Physical Exam  Nursing note and vitals reviewed. Constitutional: She is oriented to person, place, and time. She appears well-developed and well-nourished.  HENT:  Head: Normocephalic.  Nose: Nose normal.  Mouth/Throat: Oropharynx is clear and moist.  Eyes: Conjunctivae are normal. Pupils are equal, round, and reactive to light.  Neck: Normal range  of motion. Neck supple. No JVD present.  Cardiovascular: Normal rate, regular rhythm, S1 normal, S2 normal, normal heart sounds and intact distal pulses.  Exam reveals no gallop and no friction rub.   No murmur heard. Pulmonary/Chest: Effort normal and breath sounds normal. No respiratory distress. She has no wheezes. She  has no rales. She exhibits no tenderness.  Abdominal: Soft. Bowel sounds are normal. She exhibits no distension. There is no tenderness.  Musculoskeletal: Normal range of motion. She exhibits no edema and no tenderness.  Lymphadenopathy:    She has no cervical adenopathy.  Neurological: She is alert and oriented to person, place, and time. Coordination normal.  Skin: Skin is warm and dry. No rash noted. No erythema.  Psychiatric: She has a normal mood and affect. Her behavior is normal. Judgment and thought content normal.    Assessment and Plan

## 2013-08-01 NOTE — Assessment & Plan Note (Signed)
Ectopy seems to be well controlled on low-dose Coreg. No changes made

## 2013-08-01 NOTE — Assessment & Plan Note (Signed)
Blood pressure is well controlled on today's visit. No changes made to the medications. 

## 2013-08-14 ENCOUNTER — Ambulatory Visit: Payer: Self-pay | Admitting: Internal Medicine

## 2013-08-21 ENCOUNTER — Ambulatory Visit: Payer: Self-pay | Admitting: Family Medicine

## 2013-08-27 ENCOUNTER — Encounter: Payer: BC Managed Care – PPO | Admitting: Family Medicine

## 2013-08-29 ENCOUNTER — Ambulatory Visit: Payer: Self-pay | Admitting: Family Medicine

## 2013-08-31 ENCOUNTER — Other Ambulatory Visit: Payer: Self-pay | Admitting: Physician Assistant

## 2013-09-05 ENCOUNTER — Other Ambulatory Visit: Payer: Self-pay | Admitting: Physician Assistant

## 2013-09-06 ENCOUNTER — Telehealth: Payer: Self-pay

## 2013-09-06 MED ORDER — CARVEDILOL 3.125 MG PO TABS: ORAL_TABLET | ORAL | Status: DC

## 2013-09-06 NOTE — Telephone Encounter (Signed)
Pharm LM on VM stating that they do not have a record of the Rx sent 07/08/13 for pt's carvedilol and asked that we resend it. Resent remaining RFs.

## 2013-09-12 ENCOUNTER — Ambulatory Visit: Payer: Self-pay | Admitting: Obstetrics and Gynecology

## 2013-09-12 LAB — BASIC METABOLIC PANEL
ANION GAP: 5 — AB (ref 7–16)
BUN: 6 mg/dL — AB (ref 7–18)
CO2: 30 mmol/L (ref 21–32)
Calcium, Total: 9 mg/dL (ref 8.5–10.1)
Chloride: 103 mmol/L (ref 98–107)
Creatinine: 0.44 mg/dL — ABNORMAL LOW (ref 0.60–1.30)
Glucose: 113 mg/dL — ABNORMAL HIGH (ref 65–99)
Osmolality: 274 (ref 275–301)
Potassium: 4.7 mmol/L (ref 3.5–5.1)
Sodium: 138 mmol/L (ref 136–145)

## 2013-09-12 LAB — CBC
HCT: 37.4 % (ref 35.0–47.0)
HGB: 12.3 g/dL (ref 12.0–16.0)
MCH: 28 pg (ref 26.0–34.0)
MCHC: 33 g/dL (ref 32.0–36.0)
MCV: 85 fL (ref 80–100)
Platelet: 181 10*3/uL (ref 150–440)
RBC: 4.41 10*6/uL (ref 3.80–5.20)
RDW: 26.4 % — ABNORMAL HIGH (ref 11.5–14.5)
WBC: 7.6 10*3/uL (ref 3.6–11.0)

## 2013-09-17 ENCOUNTER — Ambulatory Visit: Payer: Self-pay | Admitting: Obstetrics and Gynecology

## 2013-09-18 LAB — BASIC METABOLIC PANEL
ANION GAP: 6 — AB (ref 7–16)
BUN: 5 mg/dL — ABNORMAL LOW (ref 7–18)
Calcium, Total: 8.2 mg/dL — ABNORMAL LOW (ref 8.5–10.1)
Chloride: 107 mmol/L (ref 98–107)
Co2: 26 mmol/L (ref 21–32)
Creatinine: 0.63 mg/dL (ref 0.60–1.30)
EGFR (African American): >60
EGFR (Non-African Amer.): >60
Glucose: 163 mg/dL — ABNORMAL HIGH (ref 65–99)
Osmolality: 278 (ref 275–301)
Potassium: 3.7 mmol/L (ref 3.5–5.1)
Sodium: 139 mmol/L (ref 136–145)

## 2013-09-18 LAB — CBC WITH DIFFERENTIAL/PLATELET
BASOS PCT: 0.2 %
Basophil #: 0 10*3/uL (ref 0.0–0.1)
Eosinophil #: 0 10*3/uL (ref 0.0–0.7)
Eosinophil %: 0.3 %
HCT: 31.9 % — AB (ref 35.0–47.0)
HGB: 10.9 g/dL — AB (ref 12.0–16.0)
Lymphocyte #: 2 10*3/uL (ref 1.0–3.6)
Lymphocyte %: 18.4 %
MCH: 29.1 pg (ref 26.0–34.0)
MCHC: 34.3 g/dL (ref 32.0–36.0)
MCV: 85 fL (ref 80–100)
Monocyte #: 1 x10 3/mm — ABNORMAL HIGH (ref 0.2–0.9)
Monocyte %: 9.4 %
Neutrophil #: 7.6 10*3/uL — ABNORMAL HIGH (ref 1.4–6.5)
Neutrophil %: 71.7 %
Platelet: 146 10*3/uL — ABNORMAL LOW (ref 150–440)
RBC: 3.76 10*6/uL — AB (ref 3.80–5.20)
RDW: 25.6 % — ABNORMAL HIGH (ref 11.5–14.5)
WBC: 10.6 10*3/uL (ref 3.6–11.0)

## 2013-09-20 LAB — PATHOLOGY REPORT

## 2013-09-23 HISTORY — PX: ABDOMINAL HYSTERECTOMY: SHX81

## 2013-10-14 ENCOUNTER — Ambulatory Visit: Payer: Self-pay | Admitting: Internal Medicine

## 2013-11-05 ENCOUNTER — Ambulatory Visit: Payer: BC Managed Care – PPO | Admitting: Family Medicine

## 2013-11-06 ENCOUNTER — Ambulatory Visit: Payer: Self-pay | Admitting: Family Medicine

## 2013-12-06 ENCOUNTER — Other Ambulatory Visit: Payer: Self-pay | Admitting: Family Medicine

## 2013-12-31 ENCOUNTER — Other Ambulatory Visit: Payer: Self-pay | Admitting: Family Medicine

## 2014-02-12 ENCOUNTER — Telehealth: Payer: Self-pay

## 2014-02-12 NOTE — Telephone Encounter (Signed)
Clld pt  - [facility] on cell to schedule [facility] appt.

## 2014-03-12 ENCOUNTER — Ambulatory Visit (INDEPENDENT_AMBULATORY_CARE_PROVIDER_SITE_OTHER): Payer: BC Managed Care – PPO | Admitting: Family Medicine

## 2014-03-12 ENCOUNTER — Encounter: Payer: Self-pay | Admitting: Family Medicine

## 2014-03-12 VITALS — BP 104/76 | HR 61 | Temp 98.3°F | Resp 16 | Ht 63.5 in | Wt 156.6 lb

## 2014-03-12 DIAGNOSIS — J4520 Mild intermittent asthma, uncomplicated: Secondary | ICD-10-CM

## 2014-03-12 DIAGNOSIS — E785 Hyperlipidemia, unspecified: Secondary | ICD-10-CM

## 2014-03-12 DIAGNOSIS — E1121 Type 2 diabetes mellitus with diabetic nephropathy: Secondary | ICD-10-CM

## 2014-03-12 DIAGNOSIS — D509 Iron deficiency anemia, unspecified: Secondary | ICD-10-CM

## 2014-03-12 DIAGNOSIS — I493 Ventricular premature depolarization: Secondary | ICD-10-CM

## 2014-03-12 LAB — POCT URINALYSIS DIPSTICK
BILIRUBIN UA: NEGATIVE
GLUCOSE UA: NEGATIVE
KETONES UA: NEGATIVE
Leukocytes, UA: NEGATIVE
Nitrite, UA: NEGATIVE
Protein, UA: 30
RBC UA: NEGATIVE
Spec Grav, UA: 1.025
Urobilinogen, UA: 0.2
pH, UA: 6

## 2014-03-12 LAB — MICROALBUMIN, URINE: MICROALB UR: 2.4 mg/dL — AB (ref <2.0)

## 2014-03-12 MED ORDER — CARVEDILOL 3.125 MG PO TABS: 3.1250 mg | ORAL_TABLET | Freq: Two times a day (BID) | ORAL | Status: DC

## 2014-03-12 MED ORDER — PRAVASTATIN SODIUM 40 MG PO TABS: 40.0000 mg | ORAL_TABLET | Freq: Every day | ORAL | Status: DC

## 2014-03-12 MED ORDER — SITAGLIPTIN PHOS-METFORMIN HCL 50-1000 MG PO TABS: 1.0000 | ORAL_TABLET | Freq: Two times a day (BID) | ORAL | Status: DC

## 2014-03-12 MED ORDER — ALBUTEROL SULFATE HFA 108 (90 BASE) MCG/ACT IN AERS: 2.0000 | INHALATION_SPRAY | Freq: Four times a day (QID) | RESPIRATORY_TRACT | Status: DC | PRN
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
## 2014-03-12 NOTE — Progress Notes (Deleted)
     IDENTIFYING INFORMATION  Barbara Henson / DOB: 07/18/1965 / MRN: [id number]  The patient has Type II or unspecified type diabetes mellitus without mention of complication, not stated as uncontrolled; Essential hypertension, benign; Iron deficiency anemia, unspecified; Pure hypercholesterolemia; Allergic rhinitis; Extrinsic asthma, unspecified; Microalbuminuric diabetic nephropathy; PVC (premature ventricular contraction); and Preoperative cardiovascular examination on her problem list.  SUBJECTIVE  Chief Complaint: No chief complaint on file.   History of present illness: [name] is a 48 y.o. year old female who presents for a diabetic follow-up.  The patient's smoking status is ***.  Her BP today is ***.  The patient receives their eye care with *** yearly.  The patient's foot exam ***.  Her last lipid profile was obtained on ***, and those results are included in objective.  Her urine micro albumin was *** and A1C was ***.  Serum creatine was last measured at ***. Her flu and pneumococcal vaccinations were ***.  The patient     She  has a past medical history of Asthma; Allergy; Anemia; Diabetes mellitus without complication; Hyperlipidemia; Lipoma of abdominal wall; Unspecified vitamin D deficiency; Microalbuminuria; Herpes labialis; Cardiomyopathy, primary (08/28/2006); Premature beats; Excessive menstruation; and Heart murmur.  The patient has a current medication list which includes the following prescription(s): albuterol, carvedilol, cetirizine, cholecalciferol, ferrous sulfate, fish oil-omega-3 fatty acids, fluticasone, pravastatin, and sitagliptin-metformin.  [name] is allergic to celebrex and lisinopril. She  reports that she has never smoked. She has never used smokeless tobacco. She reports that she drinks alcohol. She reports that she does not use illicit drugs. She  reports that she currently engages in sexual activity. She reports using the following method of birth  control/protection: Other-see comments.  The patient  has past surgical history that includes Cesarean section (x 3); Wisdom tooth extraction; nasal polypectomy (1994); and Nasal polyp surgery.  Her family history includes Allergies in her daughter and son. She was adopted.  ROS  OBJECTIVE  There were no vitals taken for this visit. The patient's body mass index is unknown because there is no weight on file.  Physical Exam  No results found for this or any previous visit (from the past 24 hour(s)).  ASSESSMENT & PLAN  There are no diagnoses linked to this encounter.  The patient was instructed to to call or comeback to clinic as needed, or should symptoms warrant.  Philis Fendt, MHS, PA-C [facility] 03/12/2014 9:34 AM

## 2014-03-19 NOTE — Progress Notes (Signed)
This encounter was created in error - please disregard.

## 2014-07-09 ENCOUNTER — Ambulatory Visit: Payer: BC Managed Care – PPO | Admitting: Family Medicine

## 2014-07-23 ENCOUNTER — Ambulatory Visit: Payer: Self-pay | Admitting: Family Medicine

## 2014-09-02 ENCOUNTER — Encounter: Payer: Self-pay | Admitting: Family Medicine

## 2014-09-02 ENCOUNTER — Ambulatory Visit: Admit: 2014-09-02 | Disposition: A | Discharge status: Home / Self Care | Payer: Self-pay | Admitting: Family Medicine

## 2014-09-06 NOTE — Op Note (Signed)
PATIENT NAME:  Barbara Henson, CASAD MR#:  [id number] DATE OF BIRTH:  [birth year]  DATE OF PROCEDURE:  09/17/2013  PREOPERATIVE DIAGNOSIS:  Menorrhagia.   POSTOPERATIVE DIAGNOSIS:  Menorrhagia.   OPERATION PERFORMED:  Total laparoscopic hysterectomy, bilateral salpingectomy and cystoscopy.   ANESTHESIA:  General.   PRIMARY SURGEON:  Malachy Mood, M.D.   ASSISTANT:  Dr. Barnett Applebaum.  PREOPERATIVE ANTIBIOTICS:  2 grams of Ancef.   DRAINS OR TUBES:  Foley to gravity drainage.   IMPLANTS:  None.   ESTIMATED BLOOD LOSS:  200 mL.    OPERATIVE FLUIDS:  1 liter of crystalloid.   URINE OUTPUT:  400 mL of clear urine.   COMPLICATIONS:  None.   FINDINGS:  Normal tubes, ovaries, and uterus.  Left simple ovarian cyst approximately 2 cm in size.  Intact bladder on cystoscopy with bilateral efflux of urine from both ureters.   SPECIMENS REMOVED:  Uterus, cervix and bilateral tubes.   THE PATIENT CONDITION FOLLOWING THE PROCEDURE:  Stable.   PROCEDURE IN DETAIL:  Risks, benefits, and alternatives of the procedure were discussed with the patient prior to proceeding to the Operating Room.  The patient was taken back to the Operating Room where she was placed under general endotracheal anesthesia.  She was positioned in the dorsal lithotomy position and prepped and draped in the usual sterile fashion.  A timeout was performed prior to proceeding with the case.  Attention was turned to the patient's pelvis.  Bivalve operative speculum was placed to visualize the anterior lip of the cervix which was grasped with a single-tooth tenaculum.  A large V-Care device was then placed.  The single-tooth tenaculum and speculum were removed.  The cuff was fitted around the cervix snugly and a Foley catheter was also placed prior to placement of the V-Care device.  Attention was turned to the patient's abdomen.  The umbilicus was infiltrated with Sensorcaine.  A stab incision was made at the base of the umbilicus and  then a 5 mm XL trocar was used to gain entry into the abdomen under direct visualization.  Pneumoperitoneum was established.  Adhesions were noted close to the patient's prior C-section scar and these were taken down after placing two 5 mm lower quadrant left and right lateral assistant ports.  The adhesions were taken down using a 5 mm Harmonic device.  Attention was turned to the patient's left adnexa.  The tube was grasped and freed from its attachments to the ovary and mesosalpinx.  The utero-ovarian ligaments were transected using the 5 mm LigaSure followed by the round ligament.  The anterior leaf of the broad ligament was dissected down to the level of the internal cervical os.  The posterior leaf of the broad ligament was then taken down to the level of the left uterosacral ligament.  The uterine artery was skeletonized and a bladder blade was created before transecting the uterine artery on the left.  The right adnexal structures were then visualized.  The right tube was dissected off with attachment of the ovary and mesosalpinx using a 5 mm LigaSure which was then used to transect the utero-ovarian and round ligaments as been done on the left.  There was denser adhesions of the uterus to the right pelvic side walls which were taken down using the 5 mm Harmonic device.  The uterine artery was skeletonized and the briefly created bladder blade was closed.  The uterine artery was then transected using the 5 mm LigaSure device.  A colpotomy was  made anteriorly and carried around around circumferentially using the 5 mm Harmonic device and taking care to stay within the groove of the V-care device.  The uterus was then removed through the vagina after being freed from all of its attachments.  The pelvis was irrigated and the vaginal cuff was closed vaginally using interrupted figure-of-eight 0 Vicryl stitches.  The cystoscopy was then performed noting an intact bladder dome and bilateral efflux of urine from  both ureters.  A second look was taken from above noting the vaginal cuff pedicles to be hemostatic.  1 gram of Arista powder was applied to all pedicles.  Pneumoperitoneum was evacuated.  The 5 mm ports were removed, dressed with Dermabond.  Sponge, needle, and instrument counts were correct x 2.  The patient tolerated the procedure well and was taken to the recovery room in stable condition.     ____________________________ Stoney Bang. Georgianne Fick, MD ams:ea D: 09/19/2013 23:00:30 ET T: 09/20/2013 06:19:43 ET JOB#: [id number]  cc: Stoney Bang. Georgianne Fick, MD, <Dictator> Conan Bowens Madelon Lips MD ELECTRONICALLY SIGNED 09/22/2013 11:48

## 2014-09-24 ENCOUNTER — Encounter: Payer: Self-pay | Admitting: Family Medicine

## 2014-09-24 ENCOUNTER — Ambulatory Visit (INDEPENDENT_AMBULATORY_CARE_PROVIDER_SITE_OTHER): Payer: BLUE CROSS/BLUE SHIELD | Admitting: Family Medicine

## 2014-09-24 VITALS — BP 112/70 | HR 64 | Temp 98.4°F | Resp 16 | Ht 63.5 in | Wt 161.4 lb

## 2014-09-24 DIAGNOSIS — E119 Type 2 diabetes mellitus without complications: Secondary | ICD-10-CM

## 2014-09-24 DIAGNOSIS — E785 Hyperlipidemia, unspecified: Secondary | ICD-10-CM

## 2014-09-24 DIAGNOSIS — E1121 Type 2 diabetes mellitus with diabetic nephropathy: Secondary | ICD-10-CM

## 2014-09-24 DIAGNOSIS — I493 Ventricular premature depolarization: Secondary | ICD-10-CM | POA: Diagnosis not present

## 2014-09-24 DIAGNOSIS — J4520 Mild intermittent asthma, uncomplicated: Secondary | ICD-10-CM

## 2014-09-24 MED ORDER — CARVEDILOL 3.125 MG PO TABS: 3.1250 mg | ORAL_TABLET | Freq: Two times a day (BID) | ORAL | Status: DC

## 2014-09-24 MED ORDER — SITAGLIPTIN PHOS-METFORMIN HCL 50-1000 MG PO TABS: 1.0000 | ORAL_TABLET | Freq: Two times a day (BID) | ORAL | Status: DC

## 2014-09-24 MED ORDER — AZELASTINE HCL 0.1 % NA SOLN: 2.0000 | Freq: Two times a day (BID) | NASAL | Status: DC

## 2014-09-24 MED ORDER — ALBUTEROL SULFATE HFA 108 (90 BASE) MCG/ACT IN AERS: 2.0000 | INHALATION_SPRAY | Freq: Four times a day (QID) | RESPIRATORY_TRACT | Status: DC | PRN

## 2014-09-24 MED ORDER — AMOXICILLIN 500 MG PO CAPS: 1000.0000 mg | ORAL_CAPSULE | Freq: Two times a day (BID) | ORAL | Status: DC

## 2014-09-24 MED ORDER — PRAVASTATIN SODIUM 40 MG PO TABS: 40.0000 mg | ORAL_TABLET | Freq: Every day | ORAL | Status: DC

## 2014-09-24 NOTE — Patient Instructions (Signed)
Diabetes and Exercise Exercising regularly is important. It is not just about losing weight. It has many health benefits, such as:  Improving your overall fitness, flexibility, and endurance.  Increasing your bone density.  Helping with weight control.  Decreasing your body fat.  Increasing your muscle strength.  Reducing stress and tension.  Improving your overall health. People with diabetes who exercise gain additional benefits because exercise:  Reduces appetite.  Improves the body's use of blood sugar (glucose).  Helps lower or control blood glucose.  Decreases blood pressure.  Helps control blood lipids (such as cholesterol and triglycerides).  Improves the body's use of the hormone insulin by:  Increasing the body's insulin sensitivity.  Reducing the body's insulin needs.  Decreases the risk for heart disease because exercising:  Lowers cholesterol and triglycerides levels.  Increases the levels of good cholesterol (such as high-density lipoproteins [HDL]) in the body.  Lowers blood glucose levels. YOUR ACTIVITY PLAN  Choose an activity that you enjoy and set realistic goals. Your health care provider or diabetes educator can help you make an activity plan that works for you. Exercise regularly as directed by your health care provider. This includes:  Performing resistance training twice a week such as push-ups, sit-ups, lifting weights, or using resistance bands.  Performing 150 minutes of cardio exercises each week such as walking, running, or playing sports.  Staying active and spending no more than 90 minutes at one time being inactive. Even short bursts of exercise are good for you. Three 10-minute sessions spread throughout the day are just as beneficial as a single 30-minute session. Some exercise ideas include:  Taking the dog for a walk.  Taking the stairs instead of the elevator.  Dancing to your favorite song.  Doing an exercise  video.  Doing your favorite exercise with a friend. RECOMMENDATIONS FOR EXERCISING WITH TYPE 1 OR TYPE 2 DIABETES   Check your blood glucose before exercising. If blood glucose levels are greater than 240 mg/dL, check for urine ketones. Do not exercise if ketones are present.  Avoid injecting insulin into areas of the body that are going to be exercised. For example, avoid injecting insulin into:  The arms when playing tennis.  The legs when jogging.  Keep a record of:  Food intake before and after you exercise.  Expected peak times of insulin action.  Blood glucose levels before and after you exercise.  The type and amount of exercise you have done.  Review your records with your health care provider. Your health care provider will help you to develop guidelines for adjusting food intake and insulin amounts before and after exercising.  If you take insulin or oral hypoglycemic agents, watch for signs and symptoms of hypoglycemia. They include:  Dizziness.  Shaking.  Sweating.  Chills.  Confusion.  Drink plenty of water while you exercise to prevent dehydration or heat stroke. Body water is lost during exercise and must be replaced.  Talk to your health care provider before starting an exercise program to make sure it is safe for you. Remember, almost any type of activity is better than none. Document Released: 07/23/2003 Document Revised: 09/16/2013 Document Reviewed: 10/09/2012 ExitCare Patient Information 2015 [employer]. This information is not intended to replace advice given to you by your health care provider. Make sure you discuss any questions you have with your health care provider.  

## 2014-09-24 NOTE — Progress Notes (Signed)
Subjective:    Patient ID: Barbara Henson, female    DOB: Nov 30, 1965, 49 y.o.   MRN: [id number]  HPI  Barbara Henson is a 49 year old female who presents today for follow-up of diabetes and hyperlipidemia.  Her blood sugar has been well controlled with Janumet. She doesn't check her blood sugar regularly, but when she does it averages 130 during the day and 95 in the morning when she first wakes up. Checks her blood sugar maybe once a week, sometimes less. She can tell when her blood sugar gets low because she will get "the shakes" and can tell when it gets high because she will get a headache. Denies having any syncopal episode with hypoglycemia. She checks her feet at home and hasn't noticed any issues. Denies polyuria, polydipsia, headache, lightheadedness, dizziness, abdominal pain, or nausea.  Her diet has not been as great the past few months as she would like it to be. She has been really stressed for the last few months, and this has contributed to her eating less healthy. Her dinner meals frequently consist of fast food. She is taking her Pravastatin as prescribed and denies any side effects. Denies any muscle aches.   Her allergies have been worsened over the past few months. She normally can go months without needing her albuterol inhaler, but feels she has had to increase her inhaler this year--only using it about once per week. She doesn't feel that Zyrtec or Flonase have helped her symptoms. She has been using Pseudafed over the counter which seems to relieve her congestion and allows her to sleep at night. She complains of congestion and postnasal drip today, but denies sore throat, rhinorrhea, cough, shortness of breath.   Her anemia is much improved since she had a hysterectomy in 2015. She no longer takes her iron pills daily and denies any fatigue. Denies any easy bruising or bleeding.  Her PVCs are well controlled with Carvedilol. She will notice palpitations if she has a lot of  stress or drinks a lot of caffeine. Says she notices the PVCs maybe once every month or two, but can go several weeks without noticing.   Allergies: usually can go months without using inhaler. Doesn't feel that zyrtec helps. Wakes up still congestion, runny nose. Uses pseudafed to get rid of congestion. Doesn't use it constantly, but uses it at night to sleep. flonase doesn't help.  Review of Systems  Constitutional: Negative for fever, chills, diaphoresis and fatigue.  HENT: Positive for congestion and postnasal drip. Negative for ear discharge, ear pain, rhinorrhea, sinus pressure, sneezing, sore throat and voice change.   Eyes: Negative for pain, itching and visual disturbance.  Respiratory: Negative for cough and shortness of breath.   Cardiovascular: Negative for chest pain, palpitations and leg swelling.  Gastrointestinal: Negative for nausea, vomiting, abdominal pain, diarrhea and constipation.  Endocrine: Negative for polydipsia and polyuria.  Genitourinary: Negative for dysuria, frequency, hematuria and difficulty urinating.  Musculoskeletal: Negative for myalgias and arthralgias.  Neurological: Negative for dizziness, light-headedness and headaches.       Objective:   Physical Exam  Constitutional: She is oriented to person, place, and time. She appears well-developed and well-nourished. No distress.  BP 112/70 mmHg  Pulse 64  Temp(Src) 98.4 F (36.9 C) (Oral)  Resp 16  Ht 5' 3.5" (1.613 m)  Wt 161 lb 6.4 oz (73.211 kg)  BMI 28.14 kg/m2  SpO2 97%  LMP 07/01/2013  HENT:  Head: Normocephalic and atraumatic.  Right  Ear: Hearing, tympanic membrane, external ear and ear canal normal.  Left Ear: Hearing, tympanic membrane, external ear and ear canal normal.  Nose: Mucosal edema and rhinorrhea present.  Mouth/Throat: Uvula is midline, oropharynx is clear and moist and mucous membranes are normal. No oropharyngeal exudate, posterior oropharyngeal edema or posterior  oropharyngeal erythema.  Eyes: Conjunctivae and EOM are normal. Pupils are equal, round, and reactive to light.  Neck: Normal range of motion. Neck supple. No thyromegaly present.  Cardiovascular: Normal rate, regular rhythm, S1 normal, S2 normal, normal heart sounds, intact distal pulses and normal pulses.  Exam reveals no gallop and no friction rub.   No murmur heard. Pulses:      Dorsalis pedis pulses are 2+ on the right side, and 2+ on the left side.       Posterior tibial pulses are 2+ on the right side, and 2+ on the left side.  Pulmonary/Chest: Effort normal and breath sounds normal. She has no wheezes. She has no rhonchi. She has no rales.  Lymphadenopathy:       Head (right side): No submental, no submandibular, no tonsillar, no preauricular, no posterior auricular and no occipital adenopathy present.       Head (left side): No submental, no submandibular, no tonsillar, no preauricular, no posterior auricular and no occipital adenopathy present.    She has no cervical adenopathy.       Right: No supraclavicular adenopathy present.       Left: No supraclavicular adenopathy present.  Neurological: She is alert and oriented to person, place, and time.  Skin: Skin is warm, dry and intact.  Psychiatric: She has a normal mood and affect. Her speech is normal and behavior is normal.      Assessment & Plan:

## 2014-11-10 ENCOUNTER — Other Ambulatory Visit: Payer: Self-pay

## 2015-02-19 LAB — HEMOGLOBIN A1C: Hemoglobin A1C: 6.1

## 2015-03-09 ENCOUNTER — Other Ambulatory Visit: Payer: Self-pay | Admitting: Family Medicine

## 2015-04-05 ENCOUNTER — Other Ambulatory Visit: Payer: Self-pay | Admitting: Family Medicine

## 2015-04-27 ENCOUNTER — Ambulatory Visit (INDEPENDENT_AMBULATORY_CARE_PROVIDER_SITE_OTHER): Payer: BLUE CROSS/BLUE SHIELD | Admitting: Family Medicine

## 2015-04-27 ENCOUNTER — Encounter: Payer: Self-pay | Admitting: Family Medicine

## 2015-04-27 VITALS — BP 117/68 | HR 65 | Temp 99.1°F | Resp 16 | Ht 62.25 in | Wt 152.8 lb

## 2015-04-27 DIAGNOSIS — D509 Iron deficiency anemia, unspecified: Secondary | ICD-10-CM | POA: Diagnosis not present

## 2015-04-27 DIAGNOSIS — I493 Ventricular premature depolarization: Secondary | ICD-10-CM

## 2015-04-27 DIAGNOSIS — E7800 Pure hypercholesterolemia, unspecified: Secondary | ICD-10-CM | POA: Diagnosis not present

## 2015-04-27 DIAGNOSIS — J301 Allergic rhinitis due to pollen: Secondary | ICD-10-CM

## 2015-04-27 DIAGNOSIS — J4520 Mild intermittent asthma, uncomplicated: Secondary | ICD-10-CM | POA: Diagnosis not present

## 2015-04-27 DIAGNOSIS — E1121 Type 2 diabetes mellitus with diabetic nephropathy: Secondary | ICD-10-CM

## 2015-04-27 LAB — POCT URINALYSIS DIP (MANUAL ENTRY)
Bilirubin, UA: NEGATIVE
Glucose, UA: NEGATIVE
Ketones, POC UA: NEGATIVE
LEUKOCYTES UA: NEGATIVE
NITRITE UA: NEGATIVE
PH UA: 5.5
Protein Ur, POC: NEGATIVE
RBC UA: NEGATIVE
Spec Grav, UA: 1.02
UROBILINOGEN UA: 0.2

## 2015-04-27 MED ORDER — SITAGLIPTIN PHOS-METFORMIN HCL 50-1000 MG PO TABS: 1.0000 | ORAL_TABLET | Freq: Two times a day (BID) | ORAL | Status: DC

## 2015-04-27 MED ORDER — PRAVASTATIN SODIUM 40 MG PO TABS: 40.0000 mg | ORAL_TABLET | Freq: Every day | ORAL | Status: DC

## 2015-04-27 MED ORDER — CARVEDILOL 3.125 MG PO TABS: 3.1250 mg | ORAL_TABLET | Freq: Two times a day (BID) | ORAL | Status: DC

## 2015-04-27 NOTE — Progress Notes (Signed)
Subjective:    Patient ID: Barbara Henson, female    DOB: [birth year], 49 y.o.   MRN: [id number]  04/27/2015  Follow-up and Medication Refill   HPI This 49 y.o. female presents for six month follow-up:   1. DMII: Patient reports good compliance with medication, good tolerance to medication, and good symptom control.  Weight down again from 152.   HgbA1c of 6.1.  Walking.  Fasting sugars running after meals 120-130.  Morning 90-100.   Not taking ASA daily.    2.  Hyperlipidemia: Patient reports good compliance with medication, good tolerance to medication, and good symptom control.    3.  PVCs: Patient reports good compliance with medication, good tolerance to medication, and good symptom control.   Caffeine is the trigger.  4. Asthma: Patient reports good compliance with medication, good tolerance to medication, and good symptom control.  No recent issues.    5.  Allergic Rhinitis:  Patient reports good compliance with medication, good tolerance to medication, and good symptom control.    6.  Vitamin D deficiency: Vitamin D 22 with labs.  No supplementation currently.    Pap smear one year ago.  [facility]. Eye exam June 2016.    Review of Systems  Constitutional: Negative for fever, chills, diaphoresis and fatigue.  Eyes: Negative for visual disturbance.  Respiratory: Negative for cough and shortness of breath.   Cardiovascular: Negative for chest pain, palpitations and leg swelling.  Gastrointestinal: Negative for nausea, vomiting, abdominal pain, diarrhea and constipation.  Endocrine: Negative for cold intolerance, heat intolerance, polydipsia, polyphagia and polyuria.  Neurological: Negative for dizziness, tremors, seizures, syncope, facial asymmetry, speech difficulty, weakness, light-headedness, numbness and headaches.    Past Medical History  Diagnosis Date  . Asthma   . Allergy   . Anemia   . Diabetes mellitus without complication ([place])   .  Hyperlipidemia   . Lipoma of abdominal wall   . Unspecified vitamin D deficiency   . Microalbuminuria   . Herpes labialis   . Cardiomyopathy, primary ([place]) 08/28/2006    released from cardiology 2011  . Premature beats   . Excessive menstruation   . Heart murmur     past   Past Surgical History  Procedure Laterality Date  . Cesarean section  x 3    1998/2002/2004  . Wisdom tooth extraction    . Nasal polypectomy  1994  . Nasal polyp surgery    . Abdominal hysterectomy  09/23/2013    ovaries intact.  Dysmennorrhea.  [facility].   Allergies  Allergen Reactions  . Lisinopril Cough  . Celebrex [Celecoxib] Rash    Per patient from head to toes   Current Outpatient Prescriptions  Medication Sig Dispense Refill  . albuterol (PROVENTIL HFA;VENTOLIN HFA) 108 (90 BASE) MCG/ACT inhaler Inhale 2 puffs into the lungs every 6 (six) hours as needed for wheezing or shortness of breath. 1 Inhaler 2  . carvedilol (COREG) 3.125 MG tablet Take 1 tablet (3.125 mg total) by mouth 2 (two) times daily with a meal. 180 tablet 1  . cetirizine (ZYRTEC) 10 MG tablet Take 10 mg by mouth daily.    . cholecalciferol (VITAMIN D) 1000 UNITS tablet Take 1,000 Units by mouth daily.    . fish oil-omega-3 fatty acids 1000 MG capsule Take 2 g by mouth daily.    . fluticasone (VERAMYST) 27.5 MCG/SPRAY nasal spray Place 2 sprays into the nose daily. 10 g 11  . pravastatin (PRAVACHOL) 40 MG tablet Take 1  tablet (40 mg total) by mouth daily. 90 tablet 1  . sitaGLIPtin-metformin (JANUMET) 50-1000 MG tablet Take 1 tablet by mouth 2 (two) times daily with a meal. 180 tablet 1   No current facility-administered medications for this visit.   Social History   Social History  . Marital Status: Married    Spouse Name: N/A  . Number of Children: 5  . Years of Education: college   Occupational History  . accounting    Social History Main Topics  . Smoking status: Never Smoker   . Smokeless tobacco: Never Used   . Alcohol Use: Yes     Comment: moderate  . Drug Use: No  . Sexual Activity:    Partners: Male    Birth Control/ Protection: Other-see comments     Comment: 1 partner   Other Topics Concern  . Not on file   Social History Narrative   Marital status:  Married x 12 years; happily married; second marriage.      Children: 3 children (10, 13, 16); 2 stepchildren (18, 22)      Lives: with husband, 3 biological children.      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      Tobacco: none      Alcohol: socially/weekends   Patient was adopted; Family History unknown.   Exercise: Light;2 x week, cycling; her and her husband got bikes for christmas.   Family History  Problem Relation Age of Onset  . Adopted: Yes  . Allergies Daughter   . Allergies Son        Objective:    BP 117/68 mmHg  Pulse 65  Temp(Src) 99.1 F (37.3 C) (Oral)  Resp 16  Ht 5' 2.25" (1.581 m)  Wt 152 lb 12.8 oz (69.31 kg)  BMI 27.73 kg/m2  SpO2 97%  LMP 07/01/2013 Physical Exam  Constitutional: She is oriented to person, place, and time. She appears well-developed and well-nourished. No distress.  HENT:  Head: Normocephalic and atraumatic.  Right Ear: External ear normal.  Left Ear: External ear normal.  Nose: Nose normal.  Mouth/Throat: Oropharynx is clear and moist.  Eyes: Conjunctivae and EOM are normal. Pupils are equal, round, and reactive to light.  Neck: Normal range of motion. Neck supple. Carotid bruit is not present. No thyromegaly present.  Cardiovascular: Normal rate, regular rhythm, normal heart sounds and intact distal pulses.  Exam reveals no gallop and no friction rub.   No murmur heard. Pulmonary/Chest: Effort normal and breath sounds normal. She has no wheezes. She has no rales.  Abdominal: Soft. Bowel sounds are normal. She exhibits no distension and no mass. There is no tenderness. There is no rebound and no guarding.  Lymphadenopathy:    She has no cervical  adenopathy.  Neurological: She is alert and oriented to person, place, and time. No cranial nerve deficit.  Skin: Skin is warm and dry. No rash noted. She is not diaphoretic. No erythema. No pallor.  Psychiatric: She has a normal mood and affect. Her behavior is normal.   Results for orders placed or performed in visit on 03/12/14  Microalbumin, urine  Result Value Ref Range   Microalb, Ur 2.4 (H) <2.0 mg/dL  POCT urinalysis dipstick  Result Value Ref Range   Color, UA yellow    Clarity, UA clear    Glucose, UA neg    Bilirubin, UA neg    Ketones, UA neg    Spec Grav, UA 1.025  Blood, UA neg    pH, UA 6.0    Protein, UA 30    Urobilinogen, UA 0.2    Nitrite, UA neg    Leukocytes, UA Negative        Assessment & Plan:   1. Microalbuminuric diabetic nephropathy ([place])   2. Allergic rhinitis due to pollen   3. PVC (premature ventricular contraction)   4. Pure hypercholesterolemia   5. Iron deficiency anemia   6. Extrinsic asthma, mild intermittent, uncomplicated     Orders Placed This Encounter  Procedures  . Microalbumin, urine  . POCT urinalysis dipstick   Meds ordered this encounter  Medications  . pravastatin (PRAVACHOL) 40 MG tablet    Sig: Take 1 tablet (40 mg total) by mouth daily.    Dispense:  90 tablet    Refill:  1  . carvedilol (COREG) 3.125 MG tablet    Sig: Take 1 tablet (3.125 mg total) by mouth 2 (two) times daily with a meal.    Dispense:  180 tablet    Refill:  1  . sitaGLIPtin-metformin (JANUMET) 50-1000 MG tablet    Sig: Take 1 tablet by mouth 2 (two) times daily with a meal.    Dispense:  180 tablet    Refill:  1    No Follow-up on file.    Kristi Elayne Guerin, M.D. [facility] [address] [place], [place]  [address] [phone] phone [phone] fax

## 2015-04-28 LAB — MICROALBUMIN, URINE: MICROALB UR: 1.5 mg/dL

## 2015-05-04 ENCOUNTER — Other Ambulatory Visit: Payer: Self-pay | Admitting: Family Medicine

## 2015-05-14 ENCOUNTER — Encounter: Payer: Self-pay | Admitting: Family Medicine

## 2015-07-15 LAB — HM DIABETES EYE EXAM

## 2015-08-12 ENCOUNTER — Encounter: Payer: Self-pay | Admitting: Family Medicine

## 2015-10-27 ENCOUNTER — Ambulatory Visit: Payer: Self-pay | Admitting: Family Medicine

## 2015-10-30 ENCOUNTER — Ambulatory Visit: Payer: Self-pay | Admitting: Family Medicine

## 2016-02-02 ENCOUNTER — Ambulatory Visit (INDEPENDENT_AMBULATORY_CARE_PROVIDER_SITE_OTHER): Payer: BLUE CROSS/BLUE SHIELD | Admitting: Family Medicine

## 2016-02-02 ENCOUNTER — Encounter: Payer: Self-pay | Admitting: Family Medicine

## 2016-02-02 VITALS — BP 116/68 | HR 60 | Temp 98.7°F | Resp 18 | Ht 62.25 in | Wt 152.0 lb

## 2016-02-02 DIAGNOSIS — E7800 Pure hypercholesterolemia, unspecified: Secondary | ICD-10-CM

## 2016-02-02 DIAGNOSIS — E1121 Type 2 diabetes mellitus with diabetic nephropathy: Secondary | ICD-10-CM | POA: Diagnosis not present

## 2016-02-02 DIAGNOSIS — J301 Allergic rhinitis due to pollen: Secondary | ICD-10-CM | POA: Diagnosis not present

## 2016-02-02 DIAGNOSIS — Z23 Encounter for immunization: Secondary | ICD-10-CM | POA: Diagnosis not present

## 2016-02-02 DIAGNOSIS — J4520 Mild intermittent asthma, uncomplicated: Secondary | ICD-10-CM

## 2016-02-02 DIAGNOSIS — I493 Ventricular premature depolarization: Secondary | ICD-10-CM

## 2016-02-02 MED ORDER — PRAVASTATIN SODIUM 40 MG PO TABS: 40.0000 mg | ORAL_TABLET | Freq: Every day | ORAL | 1 refills | Status: DC

## 2016-02-02 MED ORDER — SITAGLIPTIN PHOS-METFORMIN HCL 50-1000 MG PO TABS: 1.0000 | ORAL_TABLET | Freq: Two times a day (BID) | ORAL | 1 refills | Status: DC

## 2016-02-02 MED ORDER — CARVEDILOL 3.125 MG PO TABS: 3.1250 mg | ORAL_TABLET | Freq: Two times a day (BID) | ORAL | 1 refills | Status: DC

## 2016-02-02 MED ORDER — ALBUTEROL SULFATE HFA 108 (90 BASE) MCG/ACT IN AERS: 2.0000 | INHALATION_SPRAY | Freq: Four times a day (QID) | RESPIRATORY_TRACT | 2 refills | Status: DC | PRN

## 2016-02-02 NOTE — Progress Notes (Signed)
By signing my name below, I, Mesha Guinyard, attest that this documentation has been prepared under the direction and in the presence of Reginia Forts, MD.  Electronically Signed: Verlee Monte, Medical Scribe. 02/02/2016. 12:12 PM.  Subjective:    Patient ID: Barbara Henson, female    DOB: 1965/11/07, 50 y.o.   MRN: [id number]  02/02/2016  Follow-up (DM follow up )  HPI  HPI Comments: Barbara Henson is a 50 y.o. female with a PMHx of HTN, and hypercholesterolemia who presents to the [facility] for T2DM follow-up. Pt states her blood sugar has been okay, but has had some spikes. Pt will get her flu shot at work next week. Pt is UTD on her mammogram, ophthalmologist, and dentist visits. Pt denies leg swelling, numbness, tingling, or burning.   Lab Results  Component Value Date   MICROALBUR 1.5 04/27/2015   Cardio: Pt reports palpitations when stressed or when she drank too much coffee. Pt denies chest pain.  GERD: Pt had heart burn when she eats spicy foods.  Asthma: Pt used her albuterol frequently early summer, but hasn't had to use it recently. Pt gets itchy and watery eyes when she has allergy flare ups.  HIV: Pt hasn't had HIV screening recently but would like to.  Immunizations: Pt would like to get a PNA booster. Immunization History  Administered Date(s) Administered  . Hepatitis B 05/16/2008  . Influenza Split 01/15/2015  . Influenza-Unspecified 02/13/2013, 02/13/2014  . Pneumococcal Polysaccharide-23 04/25/2007, 02/02/2016  . Pneumococcal-Unspecified 04/25/2007  . Tdap 05/16/2008   Review of Systems  Constitutional: Negative for chills, diaphoresis, fatigue and fever.  Eyes: Negative for visual disturbance.  Respiratory: Negative for cough and shortness of breath.   Cardiovascular: Positive for palpitations. Negative for chest pain and leg swelling.  Gastrointestinal: Negative for abdominal pain, constipation, diarrhea, nausea and vomiting.    Endocrine: Negative for cold intolerance, heat intolerance, polydipsia, polyphagia and polyuria.  Allergic/Immunologic: Positive for environmental allergies.  Neurological: Negative for dizziness, tremors, seizures, syncope, facial asymmetry, speech difficulty, weakness, light-headedness, numbness and headaches.   Past Medical History:  Diagnosis Date  . Allergy   . Anemia   . Asthma   . Cardiomyopathy, primary ([place]) 08/28/2006   released from cardiology 2011  . Diabetes mellitus without complication ([place])   . Excessive menstruation   . Heart murmur    past  . Herpes labialis   . Hyperlipidemia   . Lipoma of abdominal wall   . Microalbuminuria   . Premature beats   . Unspecified vitamin D deficiency    Past Surgical History:  Procedure Laterality Date  . ABDOMINAL HYSTERECTOMY  09/23/2013   ovaries intact.  Dysmennorrhea.  [facility].  Marland Kitchen CESAREAN SECTION  x 3   1998/2002/2004  . NASAL POLYP SURGERY    . nasal polypectomy  1994  . WISDOM TOOTH EXTRACTION     Allergies  Allergen Reactions  . Lisinopril Cough  . Celebrex [Celecoxib] Rash    Per patient from head to toes   Current Outpatient Prescriptions  Medication Sig Dispense Refill  . albuterol (PROVENTIL HFA;VENTOLIN HFA) 108 (90 Base) MCG/ACT inhaler Inhale 2 puffs into the lungs every 6 (six) hours as needed for wheezing or shortness of breath. 1 Inhaler 2  . carvedilol (COREG) 3.125 MG tablet Take 1 tablet (3.125 mg total) by mouth 2 (two) times daily with a meal. 180 tablet 1  . cetirizine (ZYRTEC) 10 MG tablet Take 10 mg by mouth daily.    Marland Kitchen  cholecalciferol (VITAMIN D) 1000 UNITS tablet Take 1,000 Units by mouth daily.    . fish oil-omega-3 fatty acids 1000 MG capsule Take 2 g by mouth daily.    . fluticasone (VERAMYST) 27.5 MCG/SPRAY nasal spray Place 2 sprays into the nose daily. 10 g 11  . pravastatin (PRAVACHOL) 40 MG tablet Take 1 tablet (40 mg total) by mouth daily. 90 tablet 1  .  sitaGLIPtin-metformin (JANUMET) 50-1000 MG tablet Take 1 tablet by mouth 2 (two) times daily with a meal. 180 tablet 1   No current facility-administered medications for this visit.    Social History   Social History  . Marital status: Married    Spouse name: N/A  . Number of children: 5  . Years of education: college   Occupational History  . accounting    Social History Main Topics  . Smoking status: Never Smoker  . Smokeless tobacco: Never Used  . Alcohol use Yes     Comment: moderate  . Drug use: No  . Sexual activity: Yes    Partners: Male    Birth control/ protection: Other-see comments     Comment: 1 partner   Other Topics Concern  . Not on file   Social History Narrative   Marital status:  Married x 12 years; happily married; second marriage.      Children: 3 children (10, 13, 16); 2 stepchildren (18, 22)      Lives: with husband, 3 biological children.      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      Tobacco: none      Alcohol: socially/weekends   Patient was adopted; Family History unknown.   Exercise: Light;2 x week, cycling; her and her husband got bikes for christmas.   Family History  Problem Relation Age of Onset  . Adopted: Yes  . Allergies Daughter   . Allergies Son     Objective:    BP 116/68   Pulse 60   Temp 98.7 F (37.1 C) (Oral)   Resp 18   Ht 5' 2.25" (1.581 m)   Wt 152 lb (68.9 kg)   LMP 07/01/2013   SpO2 98%   BMI 27.58 kg/m  Physical Exam  Constitutional: She is oriented to person, place, and time. She appears well-developed and well-nourished. No distress.  HENT:  Head: Normocephalic and atraumatic.  Right Ear: External ear normal.  Left Ear: External ear normal.  Nose: Nose normal.  Mouth/Throat: Oropharynx is clear and moist.  Eyes: Conjunctivae and EOM are normal. Pupils are equal, round, and reactive to light.  Neck: Normal range of motion. Neck supple. Carotid bruit is not present. No thyromegaly  present.  Cardiovascular: Normal rate, regular rhythm, normal heart sounds and intact distal pulses.  Exam reveals no gallop and no friction rub.   No murmur heard. Pulmonary/Chest: Effort normal and breath sounds normal. No respiratory distress. She has no wheezes. She has no rales.  Abdominal: Soft. Bowel sounds are normal. She exhibits no distension and no mass. There is no tenderness. There is no rebound and no guarding.  Musculoskeletal: She exhibits no edema.  Lymphadenopathy:    She has no cervical adenopathy.  Neurological: She is alert and oriented to person, place, and time. No cranial nerve deficit.  Skin: Skin is warm and dry. No rash noted. She is not diaphoretic. No erythema. No pallor.  Psychiatric: She has a normal mood and affect. Her behavior is normal.  Nursing note and vitals  reviewed.  Results for orders placed or performed in visit on 08/12/15  HM DIABETES EYE EXAM  Result Value Ref Range   HM Diabetic Eye Exam No Retinopathy No Retinopathy    Assessment & Plan:   1. Microalbuminuric diabetic nephropathy ([place])   2. Extrinsic asthma, mild intermittent, uncomplicated   3. Pure hypercholesterolemia   4. Chronic seasonal allergic rhinitis due to pollen   5. PVC (premature ventricular contraction)   6. Need for pneumococcal vaccination    -controlled; obtain labs; refills provided.   Orders Placed This Encounter  Procedures  . Pneumococcal polysaccharide vaccine 23-valent greater than or equal to 2yo subcutaneous/IM   Meds ordered this encounter  Medications  . sitaGLIPtin-metformin (JANUMET) 50-1000 MG tablet    Sig: Take 1 tablet by mouth 2 (two) times daily with a meal.    Dispense:  180 tablet    Refill:  1  . pravastatin (PRAVACHOL) 40 MG tablet    Sig: Take 1 tablet (40 mg total) by mouth daily.    Dispense:  90 tablet    Refill:  1  . carvedilol (COREG) 3.125 MG tablet    Sig: Take 1 tablet (3.125 mg total) by mouth 2 (two) times daily with a meal.     Dispense:  180 tablet    Refill:  1  . albuterol (PROVENTIL HFA;VENTOLIN HFA) 108 (90 Base) MCG/ACT inhaler    Sig: Inhale 2 puffs into the lungs every 6 (six) hours as needed for wheezing or shortness of breath.    Dispense:  1 Inhaler    Refill:  2    Return in about 6 months (around 08/01/2016) for recheck.  I personally performed the services described in this documentation, which was scribed in my presence. The recorded information has been reviewed and considered.  Tecora Eustache Elayne Guerin, M.D. [facility] [address] [place], [place]  [address] [phone] phone [phone] fax

## 2016-02-02 NOTE — Patient Instructions (Signed)
     IF you received an x-ray today, you will receive an invoice from [facility]. Please contact [facility] at [phone] with questions or concerns regarding your invoice.   IF you received labwork today, you will receive an invoice from [facility]. Please contact [facility] at [phone] with questions or concerns regarding your invoice.   Our billing staff will not be able to assist you with questions regarding bills from these companies.  You will be contacted with the lab results as soon as they are available. The fastest way to get your results is to activate your My Chart account. Instructions are located on the last page of this paperwork. If you have not heard from us regarding the results in 2 weeks, please contact this office.      

## 2016-03-07 ENCOUNTER — Telehealth: Payer: Self-pay | Admitting: *Deleted

## 2016-03-07 NOTE — Telephone Encounter (Signed)
Per Dr. Tamala Julian please call patient about labs from 02/18/16.   1.Her cholesterol is high at 217 (should be below 199).  Has she been taking cholesterol medication? 2. A1C is at goal at 6.0. 3. HIV negative  Called patient and vm is full.

## 2016-03-12 ENCOUNTER — Encounter: Payer: Self-pay | Admitting: *Deleted

## 2016-03-12 NOTE — Telephone Encounter (Signed)
Mychart message also sent.  Looks like she may not use it.

## 2016-03-12 NOTE — Telephone Encounter (Signed)
Tried calling again and vm was full.  Unable to reach letter sent.

## 2016-03-28 ENCOUNTER — Encounter: Payer: Self-pay | Admitting: Family Medicine

## 2016-06-23 ENCOUNTER — Telehealth: Payer: Self-pay

## 2016-06-23 NOTE — Telephone Encounter (Signed)
Left message for patient to call back and reschedule her appointment with Dr Tamala Julian on 08/09/16 due to her being out of the office that day. Anyone can make her another appointment on Dr Thompson Caul next open day. Thank you

## 2016-08-09 ENCOUNTER — Ambulatory Visit: Payer: BLUE CROSS/BLUE SHIELD | Admitting: Family Medicine

## 2016-09-19 DIAGNOSIS — Z1231 Encounter for screening mammogram for malignant neoplasm of breast: Secondary | ICD-10-CM | POA: Diagnosis not present

## 2016-10-17 DIAGNOSIS — R040 Epistaxis: Secondary | ICD-10-CM | POA: Diagnosis not present

## 2017-04-12 ENCOUNTER — Ambulatory Visit: Payer: BLUE CROSS/BLUE SHIELD | Admitting: Family Medicine

## 2017-04-15 ENCOUNTER — Ambulatory Visit (INDEPENDENT_AMBULATORY_CARE_PROVIDER_SITE_OTHER): Payer: BLUE CROSS/BLUE SHIELD | Admitting: Family Medicine

## 2017-04-15 ENCOUNTER — Encounter: Payer: Self-pay | Admitting: Family Medicine

## 2017-04-15 ENCOUNTER — Other Ambulatory Visit: Payer: Self-pay

## 2017-04-15 VITALS — BP 124/80 | HR 72 | Temp 98.0°F | Resp 16 | Ht 63.78 in | Wt 169.0 lb

## 2017-04-15 DIAGNOSIS — Z23 Encounter for immunization: Secondary | ICD-10-CM | POA: Diagnosis not present

## 2017-04-15 DIAGNOSIS — Z7251 High risk heterosexual behavior: Secondary | ICD-10-CM

## 2017-04-15 DIAGNOSIS — J4540 Moderate persistent asthma, uncomplicated: Secondary | ICD-10-CM

## 2017-04-15 DIAGNOSIS — Z0000 Encounter for general adult medical examination without abnormal findings: Secondary | ICD-10-CM | POA: Diagnosis not present

## 2017-04-15 DIAGNOSIS — F430 Acute stress reaction: Secondary | ICD-10-CM

## 2017-04-15 DIAGNOSIS — I493 Ventricular premature depolarization: Secondary | ICD-10-CM

## 2017-04-15 DIAGNOSIS — E7800 Pure hypercholesterolemia, unspecified: Secondary | ICD-10-CM

## 2017-04-15 DIAGNOSIS — J301 Allergic rhinitis due to pollen: Secondary | ICD-10-CM

## 2017-04-15 DIAGNOSIS — B001 Herpesviral vesicular dermatitis: Secondary | ICD-10-CM

## 2017-04-15 DIAGNOSIS — E1121 Type 2 diabetes mellitus with diabetic nephropathy: Secondary | ICD-10-CM

## 2017-04-15 LAB — POCT URINALYSIS DIP (MANUAL ENTRY)
BILIRUBIN UA: NEGATIVE
BILIRUBIN UA: NEGATIVE mg/dL
Blood, UA: NEGATIVE
LEUKOCYTES UA: NEGATIVE
Nitrite, UA: NEGATIVE
Protein Ur, POC: 30 mg/dL — AB
Spec Grav, UA: 1.025 (ref 1.010–1.025)
Urobilinogen, UA: 1 E.U./dL
pH, UA: 7 (ref 5.0–8.0)

## 2017-04-15 MED ORDER — TYPHOID VACCINE PO CPDR: 1.0000 | DELAYED_RELEASE_CAPSULE | ORAL | 0 refills | Status: DC

## 2017-04-15 MED ORDER — ALBUTEROL SULFATE HFA 108 (90 BASE) MCG/ACT IN AERS: 2.0000 | INHALATION_SPRAY | Freq: Four times a day (QID) | RESPIRATORY_TRACT | 2 refills | Status: DC | PRN

## 2017-04-15 MED ORDER — CARVEDILOL 3.125 MG PO TABS: 3.1250 mg | ORAL_TABLET | Freq: Two times a day (BID) | ORAL | 1 refills | Status: DC

## 2017-04-15 MED ORDER — SITAGLIPTIN PHOS-METFORMIN HCL 50-1000 MG PO TABS: 1.0000 | ORAL_TABLET | Freq: Two times a day (BID) | ORAL | 1 refills | Status: DC

## 2017-04-15 MED ORDER — VALACYCLOVIR HCL 1 G PO TABS: 2000.0000 mg | ORAL_TABLET | Freq: Two times a day (BID) | ORAL | 1 refills | Status: DC

## 2017-04-15 MED ORDER — PRAVASTATIN SODIUM 40 MG PO TABS: 40.0000 mg | ORAL_TABLET | Freq: Every day | ORAL | 1 refills | Status: DC

## 2017-04-15 NOTE — Progress Notes (Signed)
Subjective:    Patient ID: Barbara Henson, female    DOB: 1965/05/18, 51 y.o.   MRN: [id number]  04/15/2017  Annual Exam    HPI This 51 y.o. female presents for Complete Physical Examination.  Last physical: not sure Pap smear: hysterectomy due to DUB.  Mammogram: 09/2016 Colonoscopy:  never   Divorced; got terminated from job of 25 years.  51 year old was attending high school charter school/stem school in [place].  October of last year moved out and moved in with girlfriend.  This year husband wanted the house.  Had to look for an apartment.  Called in VP at [facility] had no placement; thanks for service, time to part ways. Lost job in August.  Moved in apartment September 2018.  Will finalize divorce 04-24-17.  Went to bed and did not get out.  When did get out of bed, lost job.  Then went back to bed.  No religion for years; her and daughter have been searching for a religion.  Met someone who is Muslim.  Living with son and daughter.  Youngest daughter is with husband.  Autism spectrum; stayed in the house.  No structured visitation.   [place].   Husband in [place].  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  No sleeping at night. Planning trip to [place].    Typhoid and Anthrax and yellow fever.  Considering into [place].  Visa; will stay as long as allows.  Three month duration.  Islamic class on Thursday.  Volunteers at [employer].    HSV Labialis: requesting Valtrex rx.    Visual Acuity Screening   Right eye Left eye Both eyes  Without correction:     With correction: '20/20 20/20 20/20 '    BP Readings from Last 3 Encounters:  04/15/17 124/80  02/02/16 116/68  04/27/15 117/68   Wt Readings from Last 3 Encounters:  04/15/17 169 lb (76.7 kg)  02/02/16 152 lb (68.9 kg)  04/27/15 152 lb 12.8 oz (69.3 kg)   Immunization History  Administered Date(s) Administered  . Hepatitis B 05/16/2008  . Influenza Split 01/15/2015  . Influenza,inj,Quad  PF,6+ Mos 04/15/2017  . Influenza-Unspecified 02/13/2013, 02/13/2014  . Pneumococcal Polysaccharide-23 04/25/2007, 02/02/2016  . Pneumococcal-Unspecified 04/25/2007  . Tdap 05/16/2008    Review of Systems  Constitutional: Negative for activity change, appetite change, chills, diaphoresis, fatigue, fever and unexpected weight change.  HENT: Negative for congestion, dental problem, drooling, ear discharge, ear pain, facial swelling, hearing loss, mouth sores, nosebleeds, postnasal drip, rhinorrhea, sinus pressure, sneezing, sore throat, tinnitus, trouble swallowing and voice change.   Eyes: Negative for photophobia, pain, discharge, redness, itching and visual disturbance.  Respiratory: Negative for apnea, cough, choking, chest tightness, shortness of breath, wheezing and stridor.   Cardiovascular: Negative for chest pain, palpitations and leg swelling.  Gastrointestinal: Negative for abdominal distention, abdominal pain, anal bleeding, blood in stool, constipation, diarrhea, nausea, rectal pain and vomiting.  Endocrine: Negative for cold intolerance, heat intolerance, polydipsia, polyphagia and polyuria.  Genitourinary: Negative for decreased urine volume, difficulty urinating, dyspareunia, dysuria, enuresis, flank pain, frequency, genital sores, hematuria, menstrual problem, pelvic pain, urgency, vaginal bleeding, vaginal discharge and vaginal pain.       Nocturia x 1.  No urinary leakage.  Musculoskeletal: Negative for arthralgias, back pain, gait problem, joint swelling, myalgias, neck pain and neck stiffness.  Skin: Negative for color change, pallor, rash and wound.  Allergic/Immunologic: Negative for environmental allergies, food allergies and immunocompromised state.  Neurological: Negative for dizziness, tremors, seizures, syncope, facial asymmetry, speech difficulty, weakness, light-headedness, numbness and headaches.  Hematological: Negative for adenopathy. Does not bruise/bleed easily.    Psychiatric/Behavioral: Positive for dysphoric mood and sleep disturbance. Negative for agitation, behavioral problems, confusion, decreased concentration, hallucinations, self-injury and suicidal ideas. The patient is not nervous/anxious and is not hyperactive.        Bedtime 1200; wakes up 0600.     Past Medical History:  Diagnosis Date  . Allergy   . Anemia   . Asthma   . Cardiomyopathy, primary ([place]) 08/28/2006   released from cardiology 2011  . Diabetes mellitus without complication ([place])   . Excessive menstruation   . Heart murmur    past  . Herpes labialis   . Hyperlipidemia   . Lipoma of abdominal wall   . Microalbuminuria   . Premature beats   . Unspecified vitamin D deficiency    Past Surgical History:  Procedure Laterality Date  . ABDOMINAL HYSTERECTOMY  09/23/2013   ovaries intact.  Dysmennorrhea.  [facility].  Marland Kitchen CESAREAN SECTION  x 3   1998/2002/2004  . NASAL POLYP SURGERY    . nasal polypectomy  1994  . WISDOM TOOTH EXTRACTION     Allergies  Allergen Reactions  . Lisinopril Cough  . Celebrex [Celecoxib] Rash    Per patient from head to toes   Current Outpatient Medications on File Prior to Visit  Medication Sig Dispense Refill  . cetirizine (ZYRTEC) 10 MG tablet Take 10 mg by mouth daily.    . cholecalciferol (VITAMIN D) 1000 UNITS tablet Take 1,000 Units by mouth daily.    . fish oil-omega-3 fatty acids 1000 MG capsule Take 2 g by mouth daily.    . fluticasone (VERAMYST) 27.5 MCG/SPRAY nasal spray Place 2 sprays into the nose daily. 10 g 11   No current facility-administered medications on file prior to visit.    Social History   Socioeconomic History  . Marital status: Married    Spouse name: Not on file  . Number of children: 5  . Years of education: college  . Highest education level: Not on file  Social Needs  . Financial resource strain: Not on file  . Food insecurity - worry: Not on file  . Food insecurity - inability: Not on file   . Transportation needs - medical: Not on file  . Transportation needs - non-medical: Not on file  Occupational History  . Occupation: accounting  Tobacco Use  . Smoking status: Never Smoker  . Smokeless tobacco: Never Used  Substance and Sexual Activity  . Alcohol use: Yes    Comment: moderate  . Drug use: No  . Sexual activity: Yes    Partners: Male    Birth control/protection: Other-see comments    Comment: 1 partner  Other Topics Concern  . Not on file  Social History Narrative   Marital status:  Divorced in 04/2017; second marriage.   Not dating.      Children: 3 children (10, 13, 16); 2 stepchildren (18, 31)      Lives: with 2 biological children; autistic daughter with husband.      Employment:  Terminated in 04/2016; [employer] accounting x 25 years.      Tobacco: none      Alcohol: socially/weekends   Patient was adopted; Family History unknown.   Exercise: Light;2 x week, cycling; her and her husband got bikes for christmas.   Family History  Adopted: Yes  Problem  Relation Age of Onset  . Allergies Daughter   . Allergies Son        Objective:    BP 124/80   Pulse 72   Temp 98 F (36.7 C) (Oral)   Resp 16   Ht 5' 3.78" (1.62 m)   Wt 169 lb (76.7 kg)   LMP 07/01/2013   SpO2 98%   BMI 29.21 kg/m  Physical Exam  Constitutional: She is oriented to person, place, and time. She appears well-developed and well-nourished. No distress.  HENT:  Head: Normocephalic and atraumatic.  Right Ear: External ear normal.  Left Ear: External ear normal.  Nose: Nose normal.  Mouth/Throat: Oropharynx is clear and moist.  Eyes: Conjunctivae and EOM are normal. Pupils are equal, round, and reactive to light.  Neck: Normal range of motion and full passive range of motion without pain. Neck supple. No JVD present. Carotid bruit is not present. No thyromegaly present.  Cardiovascular: Normal rate, regular rhythm and normal heart sounds. Exam reveals no gallop and  no friction rub.  No murmur heard. Pulmonary/Chest: Effort normal and breath sounds normal. She has no wheezes. She has no rales. Right breast exhibits no inverted nipple, no mass, no nipple discharge, no skin change and no tenderness. Left breast exhibits no inverted nipple, no mass, no nipple discharge, no skin change and no tenderness. Breasts are symmetrical.  Abdominal: Soft. Bowel sounds are normal. She exhibits no distension and no mass. There is no tenderness. There is no rebound and no guarding.  Musculoskeletal:       Right shoulder: Normal.       Left shoulder: Normal.       Cervical back: Normal.  Lymphadenopathy:    She has no cervical adenopathy.  Neurological: She is alert and oriented to person, place, and time. She has normal reflexes. No cranial nerve deficit. She exhibits normal muscle tone. Coordination normal.  Skin: Skin is warm and dry. No rash noted. She is not diaphoretic. No erythema. No pallor.  Psychiatric: She has a normal mood and affect. Her behavior is normal. Judgment and thought content normal.  Nursing note and vitals reviewed.  No results found. Depression screen [facility] 2/9 04/15/2017 02/02/2016 04/27/2015 09/24/2014 03/12/2014  Decreased Interest 1 0 0 0 0  Down, Depressed, Hopeless 1 0 0 0 0  PHQ - 2 Score 2 0 0 0 0  Altered sleeping 1 - - - -  Tired, decreased energy 1 - - - -  Change in appetite 1 - - - -  Feeling bad or failure about yourself  1 - - - -  Trouble concentrating 0 - - - -  Moving slowly or fidgety/restless 0 - - - -  Suicidal thoughts 0 - - - -  PHQ-9 Score 6 - - - -   Fall Risk  04/15/2017 02/02/2016 04/27/2015 03/12/2014  Falls in the past year? No No No No        Assessment & Plan:   1. Routine physical examination   2. Seasonal allergic rhinitis due to pollen   3. Microalbuminuric diabetic nephropathy ([place])   4. Pure hypercholesterolemia   5. Need for prophylactic vaccination and inoculation against influenza   6. 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 PVC (premature ventricular contraction)   8. Moderate persistent extrinsic asthma without complication   9. Stress reaction   10. Herpes labialis     -anticipatory guidance provided --- exercise, weight loss, safe driving practices, aspirin 29m daily. -  obtain age appropriate screening labs and labs for chronic disease management. -Patient has experienced overwhelming stress including divorce and termination from job.  Counseling provided during visit.  Patient reports doing much better emotionally in the recent few months.  Has good support from teenage children and friends. -Due to recent separation from husband, will obtain STD screening. -Patient noncompliant with diabetic diet and medications due to financial restrictions as well as stressors.  Obtain labs and refills provided.    Orders Placed This Encounter  Procedures  . Flu Vaccine QUAD 36+ mos IM  . CBC with Differential/Platelet  . Comprehensive metabolic panel    Order Specific Question:   Has the patient fasted?    Answer:   No  . Hemoglobin A1c  . Lipid panel    Order Specific Question:   Has the patient fasted?    Answer:   No  . TSH  . Microalbumin, urine  . RPR  . HIV antibody  . RPR  . HIV antibody  . POCT urinalysis dipstick   Meds ordered this encounter  Medications  . sitaGLIPtin-metformin (JANUMET) 50-1000 MG tablet    Sig: Take 1 tablet by mouth 2 (two) times daily with a meal.    Dispense:  180 tablet    Refill:  1  . pravastatin (PRAVACHOL) 40 MG tablet    Sig: Take 1 tablet (40 mg total) by mouth daily.    Dispense:  90 tablet    Refill:  1  . carvedilol (COREG) 3.125 MG tablet    Sig: Take 1 tablet (3.125 mg total) by mouth 2 (two) times daily with a meal.    Dispense:  180 tablet    Refill:  1  . albuterol (PROVENTIL HFA;VENTOLIN HFA) 108 (90 Base) MCG/ACT inhaler    Sig: Inhale 2 puffs into the lungs every 6 (six) hours as needed for wheezing or shortness of  breath.    Dispense:  1 Inhaler    Refill:  2  . DISCONTD: typhoid (VIVOTIF) DR capsule    Sig: Take 1 capsule by mouth every other day.    Dispense:  4 capsule    Refill:  0  . typhoid (VIVOTIF) DR capsule    Sig: Take 1 capsule by mouth every other day.    Dispense:  4 capsule    Refill:  0  . valACYclovir (VALTREX) 1000 MG tablet    Sig: Take 2 tablets (2,000 mg total) by mouth 2 (two) times daily.    Dispense:  30 tablet    Refill:  1    No Follow-up on file.   Andrian Sabala Elayne Guerin, M.D. [facility] [address] [place], [place]  [address] [phone] phone [phone] fax

## 2017-04-15 NOTE — Patient Instructions (Addendum)
IF you received an x-ray today, you will receive an invoice from [facility]. Please contact [facility] at [phone] with questions or concerns regarding your invoice.   IF you received labwork today, you will receive an invoice from [facility]. Please contact [facility] at [phone] with questions or concerns regarding your invoice.   Our billing staff will not be able to assist you with questions regarding bills from these companies.  You will be contacted with the lab results as soon as they are available. The fastest way to get your results is to activate your My Chart account. Instructions are located on the last page of this paperwork. If you have not heard from [place] regarding the results in 2 weeks, please contact this office.      Preventive Care 40-64 Years, Female Preventive care refers to lifestyle choices and visits with your health care provider that can promote health and wellness. What does preventive care include?  A yearly physical exam. This is also called an annual well check.  Dental exams once or twice a year.  Routine eye exams. Ask your health care provider how often you should have your eyes checked.  Personal lifestyle choices, including: ? Daily care of your teeth and gums. ? Regular physical activity. ? Eating a healthy diet. ? Avoiding tobacco and drug use. ? Limiting alcohol use. ? Practicing safe sex. ? Taking low-dose aspirin daily starting at age 34. ? Taking vitamin and mineral supplements as recommended by your health care provider. What happens during an annual well check? The services and screenings done by your health care provider during your annual well check will depend on your age, overall health, lifestyle risk factors, and family history of disease. Counseling Your health care provider may ask you questions about your:  Alcohol use.  Tobacco use.  Drug use.  Emotional well-being.  Home and relationship  well-being.  Sexual activity.  Eating habits.  Work and work Statistician.  Method of birth control.  Menstrual cycle.  Pregnancy history.  Screening You may have the following tests or measurements:  Height, weight, and BMI.  Blood pressure.  Lipid and cholesterol levels. These may be checked every 5 years, or more frequently if you are over 65 years old.  Skin check.  Lung cancer screening. You may have this screening every year starting at age 102 if you have a 30-pack-year history of smoking and currently smoke or have quit within the past 15 years.  Fecal occult blood test (FOBT) of the stool. You may have this test every year starting at age 33.  Flexible sigmoidoscopy or colonoscopy. You may have a sigmoidoscopy every 5 years or a colonoscopy every 10 years starting at age 23.  Hepatitis C blood test.  Hepatitis B blood test.  Sexually transmitted disease (STD) testing.  Diabetes screening. This is done by checking your blood sugar (glucose) after you have not eaten for a while (fasting). You may have this done every 1-3 years.  Mammogram. This may be done every 1-2 years. Talk to your health care provider about when you should start having regular mammograms. This may depend on whether you have a family history of breast cancer.  BRCA-related cancer screening. This may be done if you have a family history of breast, ovarian, tubal, or peritoneal cancers.  Pelvic exam and Pap test. This may be done every 3 years starting at age 3. Starting at age 55, this may be done every 5 years if  you have a Pap test in combination with an HPV test.  Bone density scan. This is done to screen for osteoporosis. You may have this scan if you are at high risk for osteoporosis.  Discuss your test results, treatment options, and if necessary, the need for more tests with your health care provider. Vaccines Your health care provider may recommend certain vaccines, such  as:  Influenza vaccine. This is recommended every year.  Tetanus, diphtheria, and acellular pertussis (Tdap, Td) vaccine. You may need a Td booster every 10 years.  Varicella vaccine. You may need this if you have not been vaccinated.  Zoster vaccine. You may need this after age 70.  Measles, mumps, and rubella (MMR) vaccine. You may need at least one dose of MMR if you were born in 1957 or later. You may also need a second dose.  Pneumococcal 13-valent conjugate (PCV13) vaccine. You may need this if you have certain conditions and were not previously vaccinated.  Pneumococcal polysaccharide (PPSV23) vaccine. You may need one or two doses if you smoke cigarettes or if you have certain conditions.  Meningococcal vaccine. You may need this if you have certain conditions.  Hepatitis A vaccine. You may need this if you have certain conditions or if you travel or work in places where you may be exposed to hepatitis A.  Hepatitis B vaccine. You may need this if you have certain conditions or if you travel or work in places where you may be exposed to hepatitis B.  Haemophilus influenzae type b (Hib) vaccine. You may need this if you have certain conditions.  Talk to your health care provider about which screenings and vaccines you need and how often you need them. This information is not intended to replace advice given to you by your health care provider. Make sure you discuss any questions you have with your health care provider. Document Released: 05/29/2015 Document Revised: 01/20/2016 Document Reviewed: 03/03/2015 Elsevier Interactive Patient Education  2017 [employer].

## 2017-04-16 LAB — HEMOGLOBIN A1C
Est. average glucose Bld gHb Est-mCnc: 260 mg/dL
Hgb A1c MFr Bld: 10.7 % — ABNORMAL HIGH (ref 4.8–5.6)

## 2017-04-16 LAB — CBC WITH DIFFERENTIAL/PLATELET
BASOS ABS: 0 10*3/uL (ref 0.0–0.2)
Basos: 0 %
EOS (ABSOLUTE): 0.2 10*3/uL (ref 0.0–0.4)
Eos: 3 %
HEMOGLOBIN: 13.7 g/dL (ref 11.1–15.9)
Hematocrit: 40.5 % (ref 34.0–46.6)
IMMATURE GRANS (ABS): 0 10*3/uL (ref 0.0–0.1)
IMMATURE GRANULOCYTES: 0 %
LYMPHS: 37 %
Lymphocytes Absolute: 2.3 10*3/uL (ref 0.7–3.1)
MCH: 29.8 pg (ref 26.6–33.0)
MCHC: 33.8 g/dL (ref 31.5–35.7)
MCV: 88 fL (ref 79–97)
MONOCYTES: 6 %
Monocytes Absolute: 0.4 10*3/uL (ref 0.1–0.9)
NEUTROS ABS: 3.4 10*3/uL (ref 1.4–7.0)
NEUTROS PCT: 54 %
PLATELETS: 200 10*3/uL (ref 150–379)
RBC: 4.59 x10E6/uL (ref 3.77–5.28)
RDW: 13.7 % (ref 12.3–15.4)
WBC: 6.3 10*3/uL (ref 3.4–10.8)

## 2017-04-16 LAB — COMPREHENSIVE METABOLIC PANEL
ALBUMIN: 4.6 g/dL (ref 3.5–5.5)
ALT: 25 IU/L (ref 0–32)
AST: 21 IU/L (ref 0–40)
Albumin/Globulin Ratio: 2.1 (ref 1.2–2.2)
Alkaline Phosphatase: 99 IU/L (ref 39–117)
BUN / CREAT RATIO: 17 (ref 9–23)
BUN: 11 mg/dL (ref 6–24)
Bilirubin Total: 0.5 mg/dL (ref 0.0–1.2)
CALCIUM: 9.4 mg/dL (ref 8.7–10.2)
CHLORIDE: 101 mmol/L (ref 96–106)
CO2: 24 mmol/L (ref 20–29)
CREATININE: 0.64 mg/dL (ref 0.57–1.00)
GFR calc Af Amer: 120 mL/min/{1.73_m2} (ref >59)
GFR, EST NON AFRICAN AMERICAN: 104 mL/min/{1.73_m2} (ref >59)
GLUCOSE: 211 mg/dL — AB (ref 65–99)
Globulin, Total: 2.2 g/dL (ref 1.5–4.5)
Potassium: 4.2 mmol/L (ref 3.5–5.2)
Sodium: 141 mmol/L (ref 134–144)
Total Protein: 6.8 g/dL (ref 6.0–8.5)

## 2017-04-16 LAB — LIPID PANEL
CHOLESTEROL TOTAL: 190 mg/dL (ref 100–199)
Chol/HDL Ratio: 3.8 ratio (ref 0.0–4.4)
HDL: 50 mg/dL (ref >39)
LDL Calculated: 104 mg/dL — ABNORMAL HIGH (ref 0–99)
TRIGLYCERIDES: 179 mg/dL — AB (ref 0–149)
VLDL CHOLESTEROL CAL: 36 mg/dL (ref 5–40)

## 2017-04-16 LAB — TSH: TSH: 1.51 u[IU]/mL (ref 0.450–4.500)

## 2017-04-16 LAB — HIV ANTIBODY (ROUTINE TESTING W REFLEX): HIV Screen 4th Generation wRfx: NONREACTIVE

## 2017-04-16 LAB — RPR: RPR Ser Ql: NONREACTIVE

## 2017-04-16 LAB — MICROALBUMIN, URINE: Microalbumin, Urine: 59.7 ug/mL

## 2017-04-23 DIAGNOSIS — B001 Herpesviral vesicular dermatitis: Secondary | ICD-10-CM | POA: Insufficient documentation

## 2017-10-05 ENCOUNTER — Encounter: Payer: Self-pay | Admitting: Family Medicine

## 2019-04-05 DIAGNOSIS — E786 Lipoprotein deficiency: Secondary | ICD-10-CM | POA: Insufficient documentation

## 2019-04-06 ENCOUNTER — Other Ambulatory Visit: Payer: Self-pay

## 2019-04-06 ENCOUNTER — Other Ambulatory Visit: Payer: Self-pay | Admitting: Internal Medicine

## 2019-04-06 ENCOUNTER — Ambulatory Visit: Payer: Self-pay | Admitting: Internal Medicine

## 2019-04-06 DIAGNOSIS — E1169 Type 2 diabetes mellitus with other specified complication: Secondary | ICD-10-CM

## 2019-04-06 DIAGNOSIS — E782 Mixed hyperlipidemia: Secondary | ICD-10-CM

## 2019-04-06 DIAGNOSIS — E1165 Type 2 diabetes mellitus with hyperglycemia: Secondary | ICD-10-CM

## 2019-04-06 DIAGNOSIS — E786 Lipoprotein deficiency: Secondary | ICD-10-CM

## 2019-04-06 MED ORDER — GLYBURIDE 1.25 MG PO TABS: 2.5000 mg | ORAL_TABLET | Freq: Two times a day (BID) | ORAL | Status: AC

## 2019-04-06 MED ORDER — PRAVASTATIN SODIUM 40 MG PO TABS: 40.0000 mg | ORAL_TABLET | Freq: Every day | ORAL | 0 refills | Status: DC

## 2019-04-06 MED ORDER — CARVEDILOL 3.125 MG PO TABS: 3.1250 mg | ORAL_TABLET | Freq: Two times a day (BID) | ORAL | 1 refills | Status: DC

## 2019-04-06 MED ORDER — METFORMIN HCL 1000 MG PO TABS: 1000.0000 mg | ORAL_TABLET | Freq: Two times a day (BID) | ORAL | 3 refills | Status: DC

## 2019-04-06 NOTE — Progress Notes (Unsigned)
Blood sugar 373

## 2019-04-06 NOTE — Progress Notes (Signed)
Established Patient Office Visit  Subjective:  Patient ID: Barbara Henson, female    DOB: 1965/11/06  Age: 53 y.o. MRN: [id number]  CC: No chief complaint on file.   HPI Barbara Henson presents for follow up, admits to poor diabetic control due to loss of insurance and inability to afford medications. No asthma flares recently. No SOB  Past Medical History:  Diagnosis Date  . Allergy   . Anemia   . Asthma   . Cardiomyopathy, primary ([place]) 08/28/2006   released from cardiology 2011  . Diabetes mellitus without complication ([place])   . Excessive menstruation   . Heart murmur    past  . Herpes labialis   . Hyperlipidemia   . Lipoma of abdominal wall   . Microalbuminuria   . Premature beats   . Unspecified vitamin D deficiency     Past Surgical History:  Procedure Laterality Date  . ABDOMINAL HYSTERECTOMY  09/23/2013   ovaries intact.  Dysmennorrhea.  [facility].  Marland Kitchen CESAREAN SECTION  x 3   1998/2002/2004  . NASAL POLYP SURGERY    . nasal polypectomy  1994  . WISDOM TOOTH EXTRACTION      Family History  Adopted: Yes  Problem Relation Age of Onset  . Allergies Daughter   . Allergies Son     Social History   Socioeconomic History  . Marital status: Married    Spouse name: Not on file  . Number of children: 5  . Years of education: college  . Highest education level: Not on file  Occupational History  . Occupation: Press photographer  Social Needs  . Financial resource strain: Not on file  . Food insecurity    Worry: Not on file    Inability: Not on file  . Transportation needs    Medical: Not on file    Non-medical: Not on file  Tobacco Use  . Smoking status: Never Smoker  . Smokeless tobacco: Never Used  Substance and Sexual Activity  . Alcohol use: Yes    Comment: moderate  . Drug use: No  . Sexual activity: Yes    Partners: Male    Birth control/protection: Other-see comments    Comment: 1 partner  Lifestyle  . Physical activity    Days per week:  Not on file    Minutes per session: Not on file  . Stress: Not on file  Relationships  . Social Herbalist on phone: Not on file    Gets together: Not on file    Attends religious service: Not on file    Active member of club or organization: Not on file    Attends meetings of clubs or organizations: Not on file    Relationship status: Not on file  . Intimate partner violence    Fear of current or ex partner: Not on file    Emotionally abused: Not on file    Physically abused: Not on file    Forced sexual activity: Not on file  Other Topics Concern  . Not on file  Social History Narrative   Marital status:  Divorced in 04/2017; second marriage.   Not dating.      Children: 3 children (10, 13, 16); 2 stepchildren (18, 80)      Lives: with 2 biological children; autistic daughter with husband.      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      Tobacco: none  Alcohol: socially/weekends   Patient was adopted; Family History unknown.   Exercise: Light;2 x week, cycling; her and her husband got bikes for christmas.    Outpatient Medications Prior to Visit  Medication Sig Dispense Refill  . albuterol (PROVENTIL HFA;VENTOLIN HFA) 108 (90 Base) MCG/ACT inhaler Inhale 2 puffs into the lungs every 6 (six) hours as needed for wheezing or shortness of breath. 1 Inhaler 2  . carvedilol (COREG) 3.125 MG tablet Take 1 tablet (3.125 mg total) by mouth 2 (two) times daily with a meal. 180 tablet 1  . cetirizine (ZYRTEC) 10 MG tablet Take 10 mg by mouth daily.    . cholecalciferol (VITAMIN D) 1000 UNITS tablet Take 1,000 Units by mouth daily.    . fish oil-omega-3 fatty acids 1000 MG capsule Take 2 g by mouth daily.    . fluticasone (VERAMYST) 27.5 MCG/SPRAY nasal spray Place 2 sprays into the nose daily. 10 g 11  . pravastatin (PRAVACHOL) 40 MG tablet Take 1 tablet (40 mg total) by mouth daily. 90 tablet 1  . sitaGLIPtin-metformin (JANUMET)  50-1000 MG tablet Take 1 tablet by mouth 2 (two) times daily with a meal. 180 tablet 1  . typhoid (VIVOTIF) DR capsule Take 1 capsule by mouth every other day. 4 capsule 0  . valACYclovir (VALTREX) 1000 MG tablet Take 2 tablets (2,000 mg total) by mouth 2 (two) times daily. 30 tablet 1   No facility-administered medications prior to visit.     Allergies  Allergen Reactions  . Lisinopril Cough  . Celebrex [Celecoxib] Rash    Per patient from head to toes    ROS Review of Systems    Objective:    Physical Exam  LMP 07/01/2013  Wt Readings from Last 3 Encounters:  04/15/17 169 lb (76.7 kg)  02/02/16 152 lb (68.9 kg)  04/06/19 161 lb 12.8 oz (73.4 kg)     Health Maintenance Due  Topic Date Due  . PAP SMEAR-Modifier  08/15/2014  . COLONOSCOPY  02/20/2017  . OPHTHALMOLOGY EXAM  08/23/2017  . MAMMOGRAM  09/26/2017  . HEMOGLOBIN A1C  10/14/2017  . FOOT EXAM  04/15/2018  . URINE MICROALBUMIN  04/15/2018  . TETANUS/TDAP  05/16/2018  . INFLUENZA VACCINE  12/15/2018    There are no preventive care reminders to display for this patient.  Lab Results  Component Value Date   TSH 1.510 04/15/2017   Lab Results  Component Value Date   WBC 6.3 04/15/2017   HGB 13.7 04/15/2017   HCT 40.5 04/15/2017   MCV 88 04/15/2017   PLT 200 04/15/2017   Lab Results  Component Value Date   NA 141 04/15/2017   K 4.2 04/15/2017   CO2 24 04/15/2017   GLUCOSE 211 (H) 04/15/2017   BUN 11 04/15/2017   CREATININE 0.64 04/15/2017   BILITOT 0.5 04/15/2017   ALKPHOS 99 04/15/2017   AST 21 04/15/2017   ALT 25 04/15/2017   PROT 6.8 04/15/2017   ALBUMIN 4.6 04/15/2017   CALCIUM 9.4 04/15/2017   ANIONGAP 6 (L) 09/18/2013   Lab Results  Component Value Date   CHOL 190 04/15/2017   Lab Results  Component Value Date   HDL 50 04/15/2017   Lab Results  Component Value Date   LDLCALC 104 (H) 04/15/2017   Lab Results  Component Value Date   TRIG 179 (H) 04/15/2017   Lab Results   Component Value Date   CHOLHDL 3.8 04/15/2017   Lab Results  Component Value Date  HGBA1C 10.7 (H) 04/15/2017      Assessment & Plan:   Problem List Items Addressed This Visit      Other   Cholesterol  (Chronic)    Other Visit Diagnoses    Dyslipidemia with low high density lipoprotein (HDL) cholesterol with hypertriglyceridemia due to type 2 diabetes mellitus ([place])    -  Primary      No orders of the defined types were placed in this encounter.   Follow-up: No follow-ups on file.  F/u 3 months Order A1c, CMP, lipids Provide Glucometer and supplies if available  Volanda Napoleon, MD

## 2019-07-06 ENCOUNTER — Ambulatory Visit: Payer: Self-pay | Admitting: Family Medicine

## 2019-07-06 ENCOUNTER — Other Ambulatory Visit: Payer: Self-pay

## 2019-07-06 DIAGNOSIS — E119 Type 2 diabetes mellitus without complications: Secondary | ICD-10-CM

## 2019-07-06 MED ORDER — VALACYCLOVIR HCL 1 G PO TABS: 2000.0000 mg | ORAL_TABLET | Freq: Two times a day (BID) | ORAL | 1 refills | Status: DC

## 2019-07-06 NOTE — Progress Notes (Signed)
S. Need follow up labs for DM, no cmoplain, her sugar has been up she said O. Not in acute distress She looks good not in acute distress No SOB , talking normal sentences Lips shoes evident of herpes lesions A> Type 2 diabetes mellitus without complication, without long-term current use of insulin (HCC) - Plan: Lipid Panel With LDL/HDL Ratio, HgB A1c, TSH, COMPLETE METABOLIC PANEL WITH GFR Herpes labialis  P.  Requested Prescriptions   Signed Prescriptions Disp Refills  . valACYclovir (VALTREX) 1000 MG tablet 30 tablet 1    Sig: Take 2 tablets (2,000 mg total) by mouth 2 (two) times daily.

## 2019-07-13 ENCOUNTER — Ambulatory Visit: Payer: Self-pay | Admitting: Internal Medicine

## 2019-08-03 ENCOUNTER — Ambulatory Visit: Payer: Self-pay | Admitting: Internal Medicine

## 2019-08-20 LAB — LIPID PANEL WITH LDL/HDL RATIO
Cholesterol, Total: 222 mg/dL — ABNORMAL HIGH (ref 100–199)
HDL: 53 mg/dL (ref >39)
LDL Chol Calc (NIH): 121 mg/dL — ABNORMAL HIGH (ref 0–99)
LDL/HDL Ratio: 2.3 ratio (ref 0.0–3.2)
Triglycerides: 273 mg/dL — ABNORMAL HIGH (ref 0–149)
VLDL Cholesterol Cal: 48 mg/dL — ABNORMAL HIGH (ref 5–40)

## 2019-08-20 LAB — TSH: TSH: 1.81 u[IU]/mL (ref 0.450–4.500)

## 2019-08-20 LAB — HEMOGLOBIN A1C
Est. average glucose Bld gHb Est-mCnc: 220 mg/dL
Hgb A1c MFr Bld: 9.3 % — ABNORMAL HIGH (ref 4.8–5.6)

## 2019-08-31 ENCOUNTER — Other Ambulatory Visit: Payer: Self-pay

## 2019-08-31 ENCOUNTER — Ambulatory Visit: Payer: Self-pay | Admitting: Internal Medicine

## 2019-09-14 ENCOUNTER — Other Ambulatory Visit: Payer: Self-pay

## 2019-09-14 ENCOUNTER — Ambulatory Visit: Payer: Self-pay | Admitting: Internal Medicine

## 2019-09-14 DIAGNOSIS — E1165 Type 2 diabetes mellitus with hyperglycemia: Secondary | ICD-10-CM

## 2019-09-14 MED ORDER — GLYBURIDE 1.25 MG PO TABS: 5.0000 mg | ORAL_TABLET | Freq: Two times a day (BID) | ORAL | Status: AC

## 2019-09-14 MED ORDER — PRAVASTATIN SODIUM 80 MG PO TABS: 80.0000 mg | ORAL_TABLET | Freq: Every day | ORAL | 3 refills | Status: DC

## 2019-09-14 NOTE — Progress Notes (Signed)
Internal MEDICINE  Office Visit Note  Patient Name: Barbara Henson  [id number]  [id number]  Date of Service: 09/14/2019  No chief complaint on file.   HPI  F/u on DM/HTN/HLP Last labs 2021 DM- taking- metformin 1000 mg BID &  Glyburide 2.5 mg bid-  AIC 9.3 Lab done at [facility]-  HLP- mild high- on 09/04/19- lab reviewed with pt- not taking Statin regular BP:-   Not check at home   Pt is here for routine follow up.    Current Medication: Outpatient Encounter Medications as of 09/14/2019  Medication Sig Note  . albuterol (PROVENTIL HFA;VENTOLIN HFA) 108 (90 Base) MCG/ACT inhaler Inhale 2 puffs into the lungs every 6 (six) hours as needed for wheezing or shortness of breath.   . carvedilol (COREG) 3.125 MG tablet Take 1 tablet (3.125 mg total) by mouth 2 (two) times daily with a meal.   . cetirizine (ZYRTEC) 10 MG tablet Take 10 mg by mouth daily. 03/12/2014: Per patient as needed  . cholecalciferol (VITAMIN D) 1000 UNITS tablet Take 1,000 Units by mouth daily.   . fish oil-omega-3 fatty acids 1000 MG capsule Take 2 g by mouth daily.   . fluticasone (VERAMYST) 27.5 MCG/SPRAY nasal spray Place 2 sprays into the nose daily. 03/12/2014: Per patient use as needed  . metFORMIN (GLUCOPHAGE) 1000 MG tablet Take 1 tablet (1,000 mg total) by mouth 2 (two) times daily with a meal.   . pravastatin (PRAVACHOL) 40 MG tablet Take 1 tablet (40 mg total) by mouth daily.   . typhoid (VIVOTIF) DR capsule Take 1 capsule by mouth every other day.   . valACYclovir (VALTREX) 1000 MG tablet Take 2 tablets (2,000 mg total) by mouth 2 (two) times daily.    No facility-administered encounter medications on file as of 09/14/2019.    Surgical History: Past Surgical History:  Procedure Laterality Date  . ABDOMINAL HYSTERECTOMY  09/23/2013   ovaries intact.  Dysmennorrhea.  [facility].  Marland Kitchen CESAREAN SECTION  x 3   1998/2002/2004  . NASAL POLYP SURGERY    . nasal polypectomy  1994  . WISDOM TOOTH  EXTRACTION      Medical History: Past Medical History:  Diagnosis Date  . Allergy   . Anemia   . Asthma   . Cardiomyopathy, primary ([place]) 08/28/2006   released from cardiology 2011  . Diabetes mellitus without complication ([place])   . Excessive menstruation   . Heart murmur    past  . Herpes labialis   . Hyperlipidemia   . Lipoma of abdominal wall   . Microalbuminuria   . Premature beats   . Unspecified vitamin D deficiency     Family History: Family History  Adopted: Yes  Problem Relation Age of Onset  . Allergies Daughter   . Allergies Son     Social History   Socioeconomic History  . Marital status: Divorced    Spouse name: Not on file  . Number of children: 3  . Years of education: college  . Highest education level: Not on file  Occupational History  . Occupation: Press photographer  . Occupation: [employer]  Tobacco Use  . Smoking status: Never Smoker  . Smokeless tobacco: Never Used  Substance and Sexual Activity  . Alcohol use: Not Currently    Comment: moderate  . Drug use: No  . Sexual activity: Not Currently    Partners: Male    Birth control/protection: Other-see comments, None    Comment: 1 partner  Other  Topics Concern  . Not on file  Social History Narrative   Marital status:  Divorced in 04/2017; second marriage.   Not dating.      Children: 3 children (10, 13, 16); 2 stepchildren (18, 47)      Lives: with 2 biological children; autistic daughter with husband.      Employment:  Terminated in 04/2016; [employer] accounting x 25 years.      Tobacco: none      Alcohol: socially/weekends   Patient was adopted; Family History unknown.   Exercise: Light;2 x week, cycling; her and her husband got bikes for christmas.   Social Determinants of Health   Financial Resource Strain: High Risk  . Difficulty of Paying Living Expenses: Hard  Food Insecurity: Food Insecurity Present  . Worried About Charity fundraiser in the Last Year:  Sometimes true  . Ran Out of Food in the Last Year: Sometimes true  Transportation Needs: No Transportation Needs  . Lack of Transportation (Medical): No  . Lack of Transportation (Non-Medical): No  Physical Activity: Sufficiently Active  . Days of Exercise per Week: 5 days  . Minutes of Exercise per Session: 130 min  Stress: Stress Concern Present  . Feeling of Stress : Rather much  Social Connections: Unknown  . Frequency of Communication with Friends and Family: Once a week  . Frequency of Social Gatherings with Friends and Family: Never  . Attends Religious Services: More than 4 times per year  . Active Member of Clubs or Organizations: Not on file  . Attends Archivist Meetings: Not on file  . Marital Status: Not on file  Intimate Partner Violence:   . Fear of Current or Ex-Partner:   . Emotionally Abused:   Marland Kitchen Physically Abused:   . Sexually Abused:       Review of Systems  Vital Signs: LMP 07/01/2013    Physical Exam     Assessment/Plan: DM- AIC high- 9.3-  Increase glyburide to 5 mg BID HLP- high= change pravastatin to 80 mg daily HTN: coreg - 3.125 mg  Bid SAR- zyrtec or xyzal OTC recommed   General Counseling: Sheena verbalizes understanding of the findings of todays visit and agrees with plan of treatment. I have discussed any further diagnostic evaluation that may be needed or ordered today. We also reviewed her medications today. she has been encouraged to call the office with any questions or concerns that should arise related to todays visit.    No orders of the defined types were placed in this encounter.   No orders of the defined types were placed in this encounter.       Wenda Low

## 2019-12-21 ENCOUNTER — Ambulatory Visit: Payer: Self-pay | Admitting: Internal Medicine

## 2020-01-04 ENCOUNTER — Ambulatory Visit: Payer: Self-pay | Admitting: Internal Medicine

## 2020-01-24 ENCOUNTER — Other Ambulatory Visit: Payer: Self-pay | Admitting: Internal Medicine

## 2020-01-25 LAB — LIPID PANEL W/O CHOL/HDL RATIO
Cholesterol, Total: 190 mg/dL (ref 100–199)
HDL: 41 mg/dL (ref >39)
LDL Chol Calc (NIH): 103 mg/dL — ABNORMAL HIGH (ref 0–99)
Triglycerides: 271 mg/dL — ABNORMAL HIGH (ref 0–149)
VLDL Cholesterol Cal: 46 mg/dL — ABNORMAL HIGH (ref 5–40)

## 2020-01-25 LAB — HGB A1C W/O EAG: Hgb A1c MFr Bld: 11.8 % — ABNORMAL HIGH (ref 4.8–5.6)

## 2020-02-01 ENCOUNTER — Other Ambulatory Visit: Payer: Self-pay

## 2020-02-01 ENCOUNTER — Ambulatory Visit: Payer: Self-pay | Admitting: Internal Medicine

## 2020-02-01 DIAGNOSIS — I10 Essential (primary) hypertension: Secondary | ICD-10-CM

## 2020-02-01 DIAGNOSIS — N3000 Acute cystitis without hematuria: Secondary | ICD-10-CM

## 2020-02-01 DIAGNOSIS — E0865 Diabetes mellitus due to underlying condition with hyperglycemia: Secondary | ICD-10-CM

## 2020-02-01 DIAGNOSIS — E782 Mixed hyperlipidemia: Secondary | ICD-10-CM

## 2020-02-01 MED ORDER — GLIMEPIRIDE 2 MG PO TABS: 2.0000 mg | ORAL_TABLET | Freq: Two times a day (BID) | ORAL | 0 refills | Status: DC

## 2020-02-01 MED ORDER — CEPHALEXIN 500 MG PO CAPS: 500.0000 mg | ORAL_CAPSULE | Freq: Two times a day (BID) | ORAL | 0 refills | Status: AC

## 2020-02-01 NOTE — Progress Notes (Signed)
Internal MEDICINE  Office Visit Note  Patient Name: Barbara Henson  [id number]  [id number]  Date of Service: 02/01/2020  No chief complaint on file.   HPI  Pt is here for routine follow up.   DM- taking metformin1000 mg/ glyburide 5 mg both bid -  AIC  High 11.8- not taking meds regular HLP- pravachol- now 80 mg ( 40 mg was ok) causing GI issue-  Lipid ok HTN- ok on med C/o dysuria- for few days- took OTC azo. And cranberry juice  Current Medication: Outpatient Encounter Medications as of 02/01/2020  Medication Sig Note  . albuterol (PROVENTIL HFA;VENTOLIN HFA) 108 (90 Base) MCG/ACT inhaler Inhale 2 puffs into the lungs every 6 (six) hours as needed for wheezing or shortness of breath.   . carvedilol (COREG) 3.125 MG tablet Take 1 tablet (3.125 mg total) by mouth 2 (two) times daily with a meal.   . cetirizine (ZYRTEC) 10 MG tablet Take 10 mg by mouth daily. 03/12/2014: Per patient as needed  . cholecalciferol (VITAMIN D) 1000 UNITS tablet Take 1,000 Units by mouth daily.   . fish oil-omega-3 fatty acids 1000 MG capsule Take 2 g by mouth daily.   . fluticasone (VERAMYST) 27.5 MCG/SPRAY nasal spray Place 2 sprays into the nose daily. 03/12/2014: Per patient use as needed  . metFORMIN (GLUCOPHAGE) 1000 MG tablet Take 1 tablet (1,000 mg total) by mouth 2 (two) times daily with a meal.   . pravastatin (PRAVACHOL) 80 MG tablet Take 1 tablet (80 mg total) by mouth daily.   . typhoid (VIVOTIF) DR capsule Take 1 capsule by mouth every other day.   . valACYclovir (VALTREX) 1000 MG tablet Take 2 tablets (2,000 mg total) by mouth 2 (two) times daily.    No facility-administered encounter medications on file as of 02/01/2020.    Surgical History: Past Surgical History:  Procedure Laterality Date  . ABDOMINAL HYSTERECTOMY  09/23/2013   ovaries intact.  Dysmennorrhea.  [facility].  Marland Kitchen CESAREAN SECTION  x 3   1998/2002/2004  . NASAL POLYP SURGERY    . nasal polypectomy  1994  . WISDOM  TOOTH EXTRACTION      Medical History: Past Medical History:  Diagnosis Date  . Allergy   . Anemia   . Asthma   . Cardiomyopathy, primary ([place]) 08/28/2006   released from cardiology 2011  . Diabetes mellitus without complication ([place])   . Excessive menstruation   . Heart murmur    past  . Herpes labialis   . Hyperlipidemia   . Lipoma of abdominal wall   . Microalbuminuria   . Premature beats   . Unspecified vitamin D deficiency     Family History: Family History  Adopted: Yes  Problem Relation Age of Onset  . Allergies Daughter   . Allergies Son     Social History   Socioeconomic History  . Marital status: Divorced    Spouse name: Not on file  . Number of children: 3  . Years of education: college  . Highest education level: Not on file  Occupational History  . Occupation: Press photographer  . Occupation: [employer]  Tobacco Use  . Smoking status: Never Smoker  . Smokeless tobacco: Never Used  Vaping Use  . Vaping Use: Never used  Substance and Sexual Activity  . Alcohol use: Not Currently    Comment: moderate  . Drug use: No  . Sexual activity: Not Currently    Partners: Male    Birth control/protection: Other-see  comments, None    Comment: 1 partner  Other Topics Concern  . Not on file  Social History Narrative   Marital status:  Divorced in 04/2017; second marriage.   Not dating.      Children: 3 children (10, 13, 16); 2 stepchildren (18, 76)      Lives: with 2 biological children; autistic daughter with husband.      Employment:  Terminated in 04/2016; [employer] accounting x 25 years.      Tobacco: none      Alcohol: socially/weekends   Patient was adopted; Family History unknown.   Exercise: Light;2 x week, cycling; her and her husband got bikes for christmas.   Social Determinants of Health   Financial Resource Strain: High Risk  . Difficulty of Paying Living Expenses: Hard  Food Insecurity: Food Insecurity Present  . Worried About  Charity fundraiser in the Last Year: Sometimes true  . Ran Out of Food in the Last Year: Sometimes true  Transportation Needs: No Transportation Needs  . Lack of Transportation (Medical): No  . Lack of Transportation (Non-Medical): No  Physical Activity: Sufficiently Active  . Days of Exercise per Week: 5 days  . Minutes of Exercise per Session: 130 min  Stress: Stress Concern Present  . Feeling of Stress : Rather much  Social Connections: Unknown  . Frequency of Communication with Friends and Family: Once a week  . Frequency of Social Gatherings with Friends and Family: Never  . Attends Religious Services: More than 4 times per year  . Active Member of Clubs or Organizations: Not on file  . Attends Archivist Meetings: Not on file  . Marital Status: Not on file  Intimate Partner Violence:   . Fear of Current or Ex-Partner: Not on file  . Emotionally Abused: Not on file  . Physically Abused: Not on file  . Sexually Abused: Not on file      Review of Systems  Vital Signs: LMP 07/01/2013    Physical Exam     Assessment/Plan: DM: continue metformin 1000 mg bid Stop glyburide;  Start on glimepiride 4mg   Twice  HLP-  Decrease pravachol to 40 mg ( 80 was causing GI issue) UTI symptoms:    Keflex 500 mg BID  For 5 daily  General Counseling: Barbara Henson verbalizes understanding of the findings of todays visit and agrees with plan of treatment. I have discussed any further diagnostic evaluation that may be needed or ordered today. We also reviewed her medications today. she has been encouraged to call the office with any questions or concerns that should arise related to todays visit.    No orders of the defined types were placed in this encounter.   No orders of the defined types were placed in this encounter.       Wenda Low

## 2020-04-30 ENCOUNTER — Other Ambulatory Visit: Payer: Self-pay | Admitting: Internal Medicine

## 2020-05-01 LAB — COMPREHENSIVE METABOLIC PANEL
ALT: 26 IU/L (ref 0–32)
AST: 20 IU/L (ref 0–40)
Albumin/Globulin Ratio: 1.8 (ref 1.2–2.2)
Albumin: 4.4 g/dL (ref 3.8–4.9)
Alkaline Phosphatase: 102 IU/L (ref 44–121)
BUN/Creatinine Ratio: 11 (ref 9–23)
BUN: 7 mg/dL (ref 6–24)
Bilirubin Total: 0.4 mg/dL (ref 0.0–1.2)
CO2: 23 mmol/L (ref 20–29)
Calcium: 9.1 mg/dL (ref 8.7–10.2)
Chloride: 98 mmol/L (ref 96–106)
Creatinine, Ser: 0.61 mg/dL (ref 0.57–1.00)
GFR calc Af Amer: 120 mL/min/{1.73_m2} (ref >59)
GFR calc non Af Amer: 104 mL/min/{1.73_m2} (ref >59)
Globulin, Total: 2.4 g/dL (ref 1.5–4.5)
Glucose: 288 mg/dL — ABNORMAL HIGH (ref 65–99)
Potassium: 5.2 mmol/L (ref 3.5–5.2)
Sodium: 137 mmol/L (ref 134–144)
Total Protein: 6.8 g/dL (ref 6.0–8.5)

## 2020-05-01 LAB — LIPID PANEL W/O CHOL/HDL RATIO
Cholesterol, Total: 185 mg/dL (ref 100–199)
HDL: 40 mg/dL (ref >39)
LDL Chol Calc (NIH): 100 mg/dL — ABNORMAL HIGH (ref 0–99)
Triglycerides: 266 mg/dL — ABNORMAL HIGH (ref 0–149)
VLDL Cholesterol Cal: 45 mg/dL — ABNORMAL HIGH (ref 5–40)

## 2020-05-01 LAB — HGB A1C W/O EAG: Hgb A1c MFr Bld: 10.4 % — ABNORMAL HIGH (ref 4.8–5.6)

## 2020-05-02 ENCOUNTER — Encounter: Payer: Self-pay | Admitting: Internal Medicine

## 2020-05-02 ENCOUNTER — Other Ambulatory Visit: Payer: Self-pay

## 2020-05-02 ENCOUNTER — Ambulatory Visit: Payer: Self-pay | Admitting: Internal Medicine

## 2020-05-02 ENCOUNTER — Ambulatory Visit: Payer: Self-pay

## 2020-05-02 DIAGNOSIS — I10 Essential (primary) hypertension: Secondary | ICD-10-CM

## 2020-05-02 DIAGNOSIS — E1165 Type 2 diabetes mellitus with hyperglycemia: Secondary | ICD-10-CM

## 2020-05-02 DIAGNOSIS — E119 Type 2 diabetes mellitus without complications: Secondary | ICD-10-CM

## 2020-05-02 MED ORDER — BLOOD GLUCOSE METER KIT: PACK | 0 refills | Status: AC

## 2020-05-02 MED ORDER — PRAVASTATIN SODIUM 80 MG PO TABS: 80.0000 mg | ORAL_TABLET | Freq: Every day | ORAL | 3 refills | Status: DC

## 2020-05-02 MED ORDER — GLIMEPIRIDE 2 MG PO TABS: 2.0000 mg | ORAL_TABLET | Freq: Two times a day (BID) | ORAL | 3 refills | Status: DC

## 2020-05-02 MED ORDER — GLIMEPIRIDE 2 MG PO TABS: 2.0000 mg | ORAL_TABLET | Freq: Every day | ORAL | 3 refills | Status: DC

## 2020-05-02 MED ORDER — METFORMIN HCL 1000 MG PO TABS: 1000.0000 mg | ORAL_TABLET | Freq: Two times a day (BID) | ORAL | 3 refills | Status: DC

## 2020-05-02 MED ORDER — CARVEDILOL 3.125 MG PO TABS: 3.1250 mg | ORAL_TABLET | Freq: Two times a day (BID) | ORAL | 1 refills | Status: DC

## 2020-05-02 MED ORDER — PRAVASTATIN SODIUM 80 MG PO TABS
80.0000 mg | ORAL_TABLET | Freq: Every day | ORAL | 3 refills | Status: DC
Start: 2020-05-02 — End: 2020-08-29

## 2020-05-02 MED ORDER — GLIMEPIRIDE 2 MG PO TABS: 2.0000 mg | ORAL_TABLET | Freq: Two times a day (BID) | ORAL | 0 refills | Status: DC

## 2020-05-02 MED ORDER — BLOOD GLUCOSE METER KIT: PACK | 6 refills | Status: AC

## 2020-05-02 NOTE — Assessment & Plan Note (Signed)
Patient blood sugar is not currently under control. Has an appointment with the dietician. Has been on Glimepiride for a month with no allergic reaction, advised to stop medication if any reactions occur. Will send her a glucometer so she can check blood sugar regularly. Patient will call us within a month to schedule follow-up. - I encouraged the patient to regularly check blood sugar.  - I encouraged the patient to monitor diet. I encouraged the patient to eat low-carb and low-sugar to help prevent blood sugar spikes.  - I encouraged the patient to continue following their prescribed treatment plan for diabetes - I informed the patient to get help if blood sugar drops below 54mg /dL, or if suddenly have trouble thinking clearly or breathing.

## 2020-05-02 NOTE — Progress Notes (Deleted)
[place]:  Breakfast:  Lunch:  Dinner:  Snacks:  Drinks:  Physical Activity:  Sleep:  Medication Use:  Goals:  Follow up:

## 2020-05-02 NOTE — Progress Notes (Signed)
Established Patient Office Visit  Subjective:  Patient ID: Barbara Henson, female    DOB: 12/16/1965  Age: 54 y.o. MRN: [id number]  CC: No chief complaint on file.   HPI  Barbara Henson presents for follow up on medicine refills.   Past Medical History:  Diagnosis Date  . Allergy   . Anemia   . Asthma   . Cardiomyopathy, primary ([place]) 08/28/2006   released from cardiology 2011  . Diabetes mellitus without complication ([place])   . Excessive menstruation   . Heart murmur    past  . Herpes labialis   . Hyperlipidemia   . Lipoma of abdominal wall   . Microalbuminuria   . Premature beats   . Unspecified vitamin D deficiency     Past Surgical History:  Procedure Laterality Date  . ABDOMINAL HYSTERECTOMY  09/23/2013   ovaries intact.  Dysmennorrhea.  [facility].  Marland Kitchen CESAREAN SECTION  x 3   1998/2002/2004  . NASAL POLYP SURGERY    . nasal polypectomy  1994  . WISDOM TOOTH EXTRACTION      Family History  Adopted: Yes  Problem Relation Age of Onset  . Allergies Daughter   . Allergies Son     Social History   Socioeconomic History  . Marital status: Divorced    Spouse name: Not on file  . Number of children: 3  . Years of education: college  . Highest education level: Not on file  Occupational History  . Occupation: Press photographer  . Occupation: [employer]  Tobacco Use  . Smoking status: Never Smoker  . Smokeless tobacco: Never Used  Vaping Use  . Vaping Use: Never used  Substance and Sexual Activity  . Alcohol use: Not Currently    Comment: moderate  . Drug use: No  . Sexual activity: Not Currently    Partners: Male    Birth control/protection: Other-see comments, None    Comment: 1 partner  Other Topics Concern  . Not on file  Social History Narrative   Marital status:  Divorced in 04/2017; second marriage.   Not dating.      Children: 3 children (10, 13, 16); 2 stepchildren (18, 20)      Lives: with 2 biological children; autistic daughter with  husband.      Employment:  Terminated in 04/2016; [employer] accounting x 25 years.      Tobacco: none      Alcohol: socially/weekends   Patient was adopted; Family History unknown.   Exercise: Light;2 x week, cycling; her and her husband got bikes for christmas.   Social Determinants of Health   Financial Resource Strain: Not on file  Food Insecurity: Not on file  Transportation Needs: Not on file  Physical Activity: Not on file  Stress: Not on file  Social Connections: Not on file  Intimate Partner Violence: Not on file     Current Outpatient Medications:  .  albuterol (PROVENTIL HFA;VENTOLIN HFA) 108 (90 Base) MCG/ACT inhaler, Inhale 2 puffs into the lungs every 6 (six) hours as needed for wheezing or shortness of breath., Disp: 1 Inhaler, Rfl: 2 .  carvedilol (COREG) 3.125 MG tablet, Take 1 tablet (3.125 mg total) by mouth 2 (two) times daily with a meal., Disp: 180 tablet, Rfl: 1 .  cetirizine (ZYRTEC) 10 MG tablet, Take 10 mg by mouth daily., Disp: , Rfl:  .  cholecalciferol (VITAMIN D) 1000 UNITS tablet, Take 1,000 Units by mouth daily., Disp: , Rfl:  .  fish oil-omega-3 fatty acids 1000 MG capsule, Take 2 g by mouth daily., Disp: , Rfl:  .  fluticasone (VERAMYST) 27.5 MCG/SPRAY nasal spray, Place 2 sprays into the nose daily., Disp: 10 g, Rfl: 11 .  glimepiride (AMARYL) 2 MG tablet, Take 1 tablet (2 mg total) by mouth 2 (two) times daily., Disp: 180 tablet, Rfl: 3 .  metFORMIN (GLUCOPHAGE) 1000 MG tablet, Take 1 tablet (1,000 mg total) by mouth 2 (two) times daily with a meal., Disp: 180 tablet, Rfl: 3 .  pravastatin (PRAVACHOL) 80 MG tablet, Take 1 tablet (80 mg total) by mouth daily., Disp: 90 tablet, Rfl: 3 .  typhoid (VIVOTIF) DR capsule, Take 1 capsule by mouth every other day., Disp: 4 capsule, Rfl: 0 .  valACYclovir (VALTREX) 1000 MG tablet, Take 2 tablets (2,000 mg total) by mouth 2 (two) times daily., Disp: 30 tablet, Rfl: 1   Allergies  Allergen  Reactions  . Lisinopril Cough  . Celebrex [Celecoxib] Rash    Per patient from head to toes    ROS Review of Systems  Constitutional: Negative.   HENT: Negative.   Eyes: Negative.   Respiratory: Negative.   Cardiovascular: Negative.   Gastrointestinal: Negative.   Endocrine: Negative.   Genitourinary: Negative.   Musculoskeletal: Negative.   Skin: Negative.   Allergic/Immunologic: Negative.   Neurological: Negative.   Hematological: Negative.   Psychiatric/Behavioral: Negative.   All other systems reviewed and are negative.     Objective:    Physical Exam  Virtual Visit  LMP 07/01/2013  Wt Readings from Last 3 Encounters:  04/15/17 169 lb (76.7 kg)  02/02/16 152 lb (68.9 kg)  04/06/19 161 lb 12.8 oz (73.4 kg)     Health Maintenance Due  Topic Date Due  . Hepatitis C Screening  Never done  . COVID-19 Vaccine (1) Never done  . PAP SMEAR-Modifier  08/15/2014  . COLONOSCOPY  Never done  . OPHTHALMOLOGY EXAM  08/23/2017  . MAMMOGRAM  09/26/2017  . FOOT EXAM  04/15/2018  . URINE MICROALBUMIN  04/15/2018  . TETANUS/TDAP  05/16/2018  . INFLUENZA VACCINE  12/15/2019    There are no preventive care reminders to display for this patient.  Lab Results  Component Value Date   TSH 1.810 08/19/2019   Lab Results  Component Value Date   WBC 6.3 04/15/2017   HGB 13.7 04/15/2017   HCT 40.5 04/15/2017   MCV 88 04/15/2017   PLT 200 04/15/2017   Lab Results  Component Value Date   NA 137 04/30/2020   K 5.2 04/30/2020   CO2 23 04/30/2020   GLUCOSE 288 (H) 04/30/2020   BUN 7 04/30/2020   CREATININE 0.61 04/30/2020   BILITOT 0.4 04/30/2020   ALKPHOS 102 04/30/2020   AST 20 04/30/2020   ALT 26 04/30/2020   PROT 6.8 04/30/2020   ALBUMIN 4.4 04/30/2020   CALCIUM 9.1 04/30/2020   ANIONGAP 6 (L) 09/18/2013   Lab Results  Component Value Date   CHOL 185 04/30/2020   Lab Results  Component Value Date   HDL 40 04/30/2020   Lab Results  Component Value Date    LDLCALC 100 (H) 04/30/2020   Lab Results  Component Value Date   TRIG 266 (H) 04/30/2020   Lab Results  Component Value Date   CHOLHDL 3.8 04/15/2017   Lab Results  Component Value Date   HGBA1C 10.4 (H) 04/30/2020      Assessment & Plan:   Problem List Items Addressed This Visit  Cardiovascular and Mediastinum   Primary hypertension   Relevant Medications   carvedilol (COREG) 3.125 MG tablet   pravastatin (PRAVACHOL) 80 MG tablet     Endocrine   Diabetes ([place]) - Primary    Patient blood sugar is not currently under control. Has an appointment with the dietician. Has been on Glimepiride for a month with no allergic reaction, advised to stop medication if any reactions occur. Will send her a glucometer so she can check blood sugar regularly. Patient will call us within a month to schedule follow-up. - I encouraged the patient to regularly check blood sugar.  - I encouraged the patient to monitor diet. I encouraged the patient to eat low-carb and low-sugar to help prevent blood sugar spikes.  - I encouraged the patient to continue following their prescribed treatment plan for diabetes - I informed the patient to get help if blood sugar drops below 54mg /dL, or if suddenly have trouble thinking clearly or breathing.         Relevant Medications   pravastatin (PRAVACHOL) 80 MG tablet   metFORMIN (GLUCOPHAGE) 1000 MG tablet   glimepiride (AMARYL) 2 MG tablet     Ordered medications  Patient was advised to have a colonoscopy, urine check twice a year, needs to lower her Hemoglobin A1C. Patient advised to see an eye doctor. Blood sugar journal advised. Follow-up: 1 month   Cletis Athens, MD

## 2020-05-02 NOTE — Addendum Note (Signed)
Addended by: Cletis Athens on: 05/02/2020 01:23 PM   Modules accepted: Orders

## 2020-05-02 NOTE — Addendum Note (Signed)
Addended by: Cletis Athens on: 05/02/2020 01:20 PM   Modules accepted: Orders

## 2020-05-30 ENCOUNTER — Ambulatory Visit: Payer: Self-pay | Admitting: Internal Medicine

## 2020-06-19 ENCOUNTER — Telehealth: Payer: Self-pay

## 2020-06-20 ENCOUNTER — Ambulatory Visit: Payer: Self-pay

## 2020-06-20 ENCOUNTER — Ambulatory Visit: Payer: Self-pay | Admitting: Internal Medicine

## 2020-06-29 ENCOUNTER — Telehealth: Payer: Self-pay | Admitting: Pharmacy Technician

## 2020-06-29 NOTE — Telephone Encounter (Signed)
  [place], [place]  [address]  June 29, 2020    [place], [place]  [address]  Dear Barbara Henson:  This is to inform you that you are no longer eligible to receive medication assistance at [facility].  The reason(s) are:    _____Your total gross monthly household income exceeds 250% of the Federal Poverty Level.   _____Tangible assets (savings, checking, stocks/bonds, pension, retirement, etc.) exceeds our limit  _____You are eligible to receive benefits from [facility], [facility] or HIV Medication              Assistance Program _____You are eligible to receive benefits from a Medicare Part "D" plan _____You have prescription insurance  _____You are not an [place] [facility] resident __X__Failure to provide all requested proof of income information for 2022.    Medication assistance will resume once all requested financial information has been returned to our clinic.  If you have questions, please contact our clinic at [phone].    Thank you,  [facility]

## 2020-06-29 NOTE — Telephone Encounter (Signed)
Patient failed to provide 2022 proof of income.  No additional medication assistance will be provided by [facility] without the required proof of income documentation.  Patient notified by letter.  [facility]

## 2020-07-04 ENCOUNTER — Other Ambulatory Visit: Payer: Self-pay

## 2020-07-04 ENCOUNTER — Ambulatory Visit: Payer: Self-pay | Admitting: Family Medicine

## 2020-07-04 DIAGNOSIS — E1165 Type 2 diabetes mellitus with hyperglycemia: Secondary | ICD-10-CM

## 2020-07-04 DIAGNOSIS — E119 Type 2 diabetes mellitus without complications: Secondary | ICD-10-CM

## 2020-07-04 DIAGNOSIS — E1169 Type 2 diabetes mellitus with other specified complication: Secondary | ICD-10-CM

## 2020-07-04 MED ORDER — GLIMEPIRIDE 2 MG PO TABS: 2.0000 mg | ORAL_TABLET | Freq: Every day | ORAL | 3 refills | Status: DC

## 2020-07-04 NOTE — Patient Instructions (Signed)
Diabetes Mellitus Basics  Diabetes mellitus, or diabetes, is a long-term (chronic) disease. It occurs when the body does not properly use sugar (glucose) that is released from food after you eat. Diabetes mellitus may be caused by one or both of these problems:  Your pancreas does not make enough of a hormone called insulin.  Your body does not react in a normal way to the insulin that it makes. Insulin lets glucose enter cells in your body. This gives you energy. If you have diabetes, glucose cannot get into cells. This causes high blood glucose (hyperglycemia). How to treat and manage diabetes You may need to take insulin or other diabetes medicines daily to keep your glucose in balance. If you are prescribed insulin, you will learn how to give yourself insulin by injection. You may need to adjust the amount of insulin you take based on the foods that you eat. You will need to check your blood glucose levels using a glucose monitor as told by your health care provider. The readings can help determine if you have low or high blood glucose. Generally, you should have these blood glucose levels:  Before meals (preprandial): 80-130 mg/dL (4.4-7.2 mmol/L).  After meals (postprandial): below 180 mg/dL (10 mmol/L).  Hemoglobin A1c (HbA1c) level: less than 7%. Your health care provider will set treatment goals for you. Keep all follow-up visits. This is important. Follow these instructions at home: Diabetes medicines Take your diabetes medicines every day as told by your health care provider. List your diabetes medicines here:  Name of medicine: ______________________________ ? Amount (dose): _______________ Time (a.m./p.m.): _______________ Notes: ___________________________________  Name of medicine: ______________________________ ? Amount (dose): _______________ Time (a.m./p.m.): _______________ Notes: ___________________________________  Name of medicine:  ______________________________ ? Amount (dose): _______________ Time (a.m./p.m.): _______________ Notes: ___________________________________ Insulin If you use insulin, list the types of insulin you use here:  Insulin type: ______________________________ ? Amount (dose): _______________ Time (a.m./p.m.): _______________Notes: ___________________________________  Insulin type: ______________________________ ? Amount (dose): _______________ Time (a.m./p.m.): _______________ Notes: ___________________________________  Insulin type: ______________________________ ? Amount (dose): _______________ Time (a.m./p.m.): _______________ Notes: ___________________________________  Insulin type: ______________________________ ? Amount (dose): _______________ Time (a.m./p.m.): _______________ Notes: ___________________________________  Insulin type: ______________________________ ? Amount (dose): _______________ Time (a.m./p.m.): _______________ Notes: ___________________________________ Managing blood glucose Check your blood glucose levels using a glucose monitor as told by your health care provider. Write down the times that you check your glucose levels here:  Time: _______________ Notes: ___________________________________  Time: _______________ Notes: ___________________________________  Time: _______________ Notes: ___________________________________  Time: _______________ Notes: ___________________________________  Time: _______________ Notes: ___________________________________  Time: _______________ Notes: ___________________________________   Low blood glucose Low blood glucose (hypoglycemia) is when glucose is at or below 70 mg/dL (3.9 mmol/L). Symptoms may include:  Feeling: ? Hungry. ? Sweaty and clammy. ? Irritable or easily upset. ? Dizzy. ? Sleepy.  Having: ? A fast heartbeat. ? A headache. ? A change in your vision. ? Numbness around the mouth, lips, or  tongue.  Having trouble with: ? Moving (coordination). ? Sleeping. Treating low blood glucose To treat low blood glucose, eat or drink something containing sugar right away. If you can think clearly and swallow safely, follow the 15:15 rule:  Take 15 grams of a fast-acting carb (carbohydrate), as told by your health care provider.  Some fast-acting carbs are: ? Glucose tablets: take 3-4 tablets. ? Hard candy: eat 3-5 pieces. ? Fruit juice: drink 4 oz (120 mL). ? Regular (not diet) soda: drink 4-6 oz (120-180 mL). ? Honey or sugar:   eat 1 Tbsp (15 mL).  Check your blood glucose levels 15 minutes after you take the carb.  If your glucose is still at or below 70 mg/dL (3.9 mmol/L), take 15 grams of a carb again.  If your glucose does not go above 70 mg/dL (3.9 mmol/L) after 3 tries, get help right away.  After your glucose goes back to normal, eat a meal or a snack within 1 hour. Treating very low blood glucose If your glucose is at or below 54 mg/dL (3 mmol/L), you have very low blood glucose (severe hypoglycemia). This is an emergency. Do not wait to see if the symptoms will go away. Get medical help right away. Call your local emergency services (911 in the [place] Do not drive yourself to the hospital. Questions to ask your health care provider  Should I talk with a diabetes educator?  What equipment will I need to care for myself at home?  What diabetes medicines do I need? When should I take them?  How often do I need to check my blood glucose levels?  What number can I call if I have questions?  When is my follow-up visit?  Where can I find a support group for people with diabetes? Where to find more information  American Diabetes Association: www.diabetes.org  Association of Diabetes Care and Education Specialists: www.diabeteseducator.org Contact a health care provider if:  Your blood glucose is at or above 240 mg/dL (13.3 mmol/L) for 2 days in a row.  You have  been sick or have had a fever for 2 days or more, and you are not getting better.  You have any of these problems for more than 6 hours: ? You cannot eat or drink. ? You feel nauseous. ? You vomit. ? You have diarrhea. Get help right away if:  Your blood glucose is lower than 54 mg/dL (3 mmol/L).  You get confused.  You have trouble thinking clearly.  You have trouble breathing. These symptoms may represent a serious problem that is an emergency. Do not wait to see if the symptoms will go away. Get medical help right away. Call your local emergency services (911 in the [place] Do not drive yourself to the hospital. Summary  Diabetes mellitus is a chronic disease that occurs when the body does not properly use sugar (glucose) that is released from food after you eat.  Take insulin and diabetes medicines as told.  Check your blood glucose every day, as often as told.  Keep all follow-up visits. This is important. This information is not intended to replace advice given to you by your health care provider. Make sure you discuss any questions you have with your health care provider. Document Revised: 09/03/2019 Document Reviewed: 09/03/2019 Elsevier Patient Education  2021 Elsevier Inc.  

## 2020-07-04 NOTE — Progress Notes (Signed)
55 yo female presented for up on her lab results  She has no complain On exam She sounds really well No respiratory distress A/P Type 2 DM not controlled  Hyperlipdemia  Will increase glimepiride  to 2 mg bid  repeat lab after 6 weeks

## 2020-07-06 ENCOUNTER — Telehealth: Payer: Self-pay | Admitting: Pharmacy Technician

## 2020-07-06 NOTE — Telephone Encounter (Signed)
Patient has Medicaid with prescription coverage.  Does not meet [facility]'s eligibility criteria.  Pt notified by letter.  [place], [place]  [address]  July 06, 2020    Destyne Goodreau [address] [place], [place]  [address]  Dear Otila Kluver:  This is to inform you that you are no longer eligible to receive medication assistance at [facility].  The reason(s) are:    _____Your total gross monthly household income exceeds 250% of the Federal Poverty Level.   _____Tangible assets (savings, checking, stocks/bonds, pension, retirement, etc.) exceeds our limit  _X___You are eligible to receive benefits from [facility], [facility] or HIV Medication            Assistance program _____You are eligible to receive benefits from a Medicare Part "D" plan _X___You have prescription insurance  _____You are not an [facility] resident _____Failure to provide all requested proof of income information for 2022.    We regret that we are unable to help you at this time.  If your prescription coverage is terminated, please contact [facility], so that we may reassess your eligibility for our program.  If you have questions, we may be contacted at [phone].  Thank you,  [facility]

## 2020-07-14 ENCOUNTER — Other Ambulatory Visit: Payer: Self-pay

## 2020-07-14 DIAGNOSIS — E1165 Type 2 diabetes mellitus with hyperglycemia: Secondary | ICD-10-CM

## 2020-07-15 ENCOUNTER — Other Ambulatory Visit: Payer: Self-pay

## 2020-07-15 DIAGNOSIS — E1165 Type 2 diabetes mellitus with hyperglycemia: Secondary | ICD-10-CM

## 2020-07-15 MED ORDER — GLIMEPIRIDE 2 MG PO TABS: 4.0000 mg | ORAL_TABLET | Freq: Two times a day (BID) | ORAL | 3 refills | Status: DC

## 2020-08-15 ENCOUNTER — Ambulatory Visit: Payer: Self-pay | Admitting: Internal Medicine

## 2020-08-26 ENCOUNTER — Other Ambulatory Visit: Payer: Self-pay | Admitting: Family Medicine

## 2020-08-27 LAB — COMPREHENSIVE METABOLIC PANEL
ALT: 24 IU/L (ref 0–32)
AST: 14 IU/L (ref 0–40)
Albumin/Globulin Ratio: 1.8 (ref 1.2–2.2)
Albumin: 4.4 g/dL (ref 3.8–4.9)
Alkaline Phosphatase: 111 IU/L (ref 44–121)
BUN/Creatinine Ratio: 15 (ref 9–23)
BUN: 9 mg/dL (ref 6–24)
Bilirubin Total: 0.2 mg/dL (ref 0.0–1.2)
CO2: 20 mmol/L (ref 20–29)
Calcium: 9.1 mg/dL (ref 8.7–10.2)
Chloride: 105 mmol/L (ref 96–106)
Creatinine, Ser: 0.6 mg/dL (ref 0.57–1.00)
Globulin, Total: 2.5 g/dL (ref 1.5–4.5)
Glucose: 294 mg/dL — ABNORMAL HIGH (ref 65–99)
Potassium: 4.7 mmol/L (ref 3.5–5.2)
Sodium: 143 mmol/L (ref 134–144)
Total Protein: 6.9 g/dL (ref 6.0–8.5)
eGFR: 107 mL/min/{1.73_m2} (ref >59)

## 2020-08-27 LAB — LIPID PANEL WITH LDL/HDL RATIO
Cholesterol, Total: 212 mg/dL — ABNORMAL HIGH (ref 100–199)
HDL: 43 mg/dL (ref >39)
LDL Chol Calc (NIH): 107 mg/dL — ABNORMAL HIGH (ref 0–99)
LDL/HDL Ratio: 2.5 ratio (ref 0.0–3.2)
Triglycerides: 362 mg/dL — ABNORMAL HIGH (ref 0–149)
VLDL Cholesterol Cal: 62 mg/dL — ABNORMAL HIGH (ref 5–40)

## 2020-08-27 LAB — HGB A1C W/O EAG: Hgb A1c MFr Bld: 10.4 % — ABNORMAL HIGH (ref 4.8–5.6)

## 2020-08-29 ENCOUNTER — Other Ambulatory Visit: Payer: Self-pay

## 2020-08-29 ENCOUNTER — Encounter: Payer: Self-pay | Admitting: Family Medicine

## 2020-08-29 ENCOUNTER — Ambulatory Visit: Payer: Self-pay | Admitting: Family Medicine

## 2020-08-29 DIAGNOSIS — E1165 Type 2 diabetes mellitus with hyperglycemia: Secondary | ICD-10-CM

## 2020-08-29 MED ORDER — GLIMEPIRIDE 2 MG PO TABS
4.0000 mg | ORAL_TABLET | Freq: Two times a day (BID) | ORAL | 3 refills | Status: DC
  Filled 2020-08-29: qty 60, 15d supply, fill #0

## 2020-08-29 MED ORDER — PRAVASTATIN SODIUM 40 MG PO TABS
40.0000 mg | ORAL_TABLET | Freq: Every day | ORAL | 3 refills | Status: DC
  Filled 2020-08-29: qty 90, 90d supply, fill #0

## 2020-08-29 MED ORDER — METFORMIN HCL 1000 MG PO TABS
1000.0000 mg | ORAL_TABLET | Freq: Two times a day (BID) | ORAL | 3 refills | Status: DC
Start: 2020-08-29 — End: 2020-09-17
  Filled 2020-08-29: qty 60, 60d supply, fill #0

## 2020-08-29 NOTE — Progress Notes (Signed)
S. Need refill on her meds  And lab results review ON exam She ia alert and orinted not in acute dsitress A/P Diabetes not controlled Hyperlipidemia improved   Continue current meds Follow 3 months Try very hard on dieting because next step will go on insulin

## 2020-08-31 ENCOUNTER — Other Ambulatory Visit: Payer: Self-pay

## 2020-09-10 ENCOUNTER — Other Ambulatory Visit: Payer: Self-pay

## 2020-09-17 ENCOUNTER — Other Ambulatory Visit: Payer: Self-pay

## 2020-09-17 DIAGNOSIS — E1165 Type 2 diabetes mellitus with hyperglycemia: Secondary | ICD-10-CM

## 2020-09-17 MED ORDER — METFORMIN HCL 1000 MG PO TABS: 1000.0000 mg | ORAL_TABLET | Freq: Two times a day (BID) | ORAL | 3 refills | Status: DC

## 2020-09-17 MED ORDER — GLIMEPIRIDE 2 MG PO TABS: 4.0000 mg | ORAL_TABLET | Freq: Two times a day (BID) | ORAL | 3 refills | Status: DC

## 2020-09-17 MED ORDER — PRAVASTATIN SODIUM 40 MG PO TABS: 40.0000 mg | ORAL_TABLET | Freq: Every day | ORAL | 3 refills | Status: DC

## 2020-09-18 ENCOUNTER — Other Ambulatory Visit: Payer: Self-pay

## 2020-10-16 ENCOUNTER — Other Ambulatory Visit: Payer: Self-pay

## 2020-10-17 ENCOUNTER — Ambulatory Visit: Payer: Self-pay | Admitting: Cardiovascular Disease

## 2020-10-17 ENCOUNTER — Ambulatory Visit: Payer: Self-pay | Admitting: Internal Medicine

## 2020-11-14 ENCOUNTER — Ambulatory Visit: Payer: Self-pay | Admitting: Internal Medicine

## 2020-12-10 ENCOUNTER — Other Ambulatory Visit: Payer: Self-pay

## 2020-12-19 ENCOUNTER — Ambulatory Visit: Payer: Self-pay | Admitting: Internal Medicine

## 2021-01-02 ENCOUNTER — Ambulatory Visit: Payer: Self-pay | Admitting: Internal Medicine

## 2021-01-02 ENCOUNTER — Other Ambulatory Visit: Payer: Self-pay

## 2021-01-02 ENCOUNTER — Ambulatory Visit: Payer: Self-pay | Admitting: Adult Health

## 2021-01-02 LAB — LIPID PANEL WITH LDL/HDL RATIO
Cholesterol, Total: 169 mg/dL (ref 100–199)
HDL: 45 mg/dL (ref >39)
LDL Chol Calc (NIH): 92 mg/dL (ref 0–99)
LDL/HDL Ratio: 2 ratio (ref 0.0–3.2)
Triglycerides: 186 mg/dL — ABNORMAL HIGH (ref 0–149)
VLDL Cholesterol Cal: 32 mg/dL (ref 5–40)

## 2021-01-02 LAB — HEMOGLOBIN A1C
Est. average glucose Bld gHb Est-mCnc: 246 mg/dL
Hgb A1c MFr Bld: 10.2 % — ABNORMAL HIGH (ref 4.8–5.6)

## 2021-01-30 ENCOUNTER — Ambulatory Visit: Payer: Self-pay | Admitting: Adult Health

## 2021-02-13 ENCOUNTER — Ambulatory Visit: Payer: Self-pay | Admitting: Internal Medicine

## 2021-02-19 NOTE — Telephone Encounter (Signed)
Called patient to remind them about their appointment.  No answer, left voicemail asking patient to call back.

## 2021-03-20 ENCOUNTER — Ambulatory Visit: Payer: Self-pay

## 2021-03-20 ENCOUNTER — Ambulatory Visit: Payer: Self-pay | Admitting: Adult Health

## 2021-03-20 ENCOUNTER — Other Ambulatory Visit: Payer: Self-pay

## 2021-03-20 ENCOUNTER — Encounter: Payer: Self-pay | Admitting: Adult Health

## 2021-03-20 VITALS — BP 139/93 | HR 82 | Temp 98.8°F | Ht 63.0 in | Wt 176.8 lb

## 2021-03-20 DIAGNOSIS — I10 Essential (primary) hypertension: Secondary | ICD-10-CM

## 2021-03-20 DIAGNOSIS — E1165 Type 2 diabetes mellitus with hyperglycemia: Secondary | ICD-10-CM

## 2021-03-20 DIAGNOSIS — E7800 Pure hypercholesterolemia, unspecified: Secondary | ICD-10-CM

## 2021-03-20 MED ORDER — GLIMEPIRIDE 2 MG PO TABS: 2.0000 mg | ORAL_TABLET | Freq: Two times a day (BID) | ORAL | 1 refills | Status: DC

## 2021-03-20 MED ORDER — CARVEDILOL 3.125 MG PO TABS
3.1250 mg | ORAL_TABLET | Freq: Two times a day (BID) | ORAL | 1 refills | Status: DC
Start: 2021-03-20 — End: 2021-06-21

## 2021-03-20 NOTE — Progress Notes (Signed)
Established Patient Office Visit  Subjective:  Patient ID: Barbara Henson, female    DOB: Jun 23, 1965  Age: 55 y.o. MRN: [id number]  CC:  Chief Complaint  Patient presents with   Follow-up    Pt is here for follow up and their blood sugar is 296   Diabetes   Hypertension   Hyperlipidemia    HPI Barbara Henson presents for follow up diabetes, HTN, HLD  Past Medical History:  Diagnosis Date   Allergy    Anemia    Asthma    Cardiomyopathy, primary ([place]) 08/28/2006   released from cardiology 2011   Diabetes mellitus without complication ([place])    Excessive menstruation    Heart murmur    past   Herpes labialis    Hyperlipidemia    Lipoma of abdominal wall    Microalbuminuria    Premature beats    Unspecified vitamin D deficiency     Past Surgical History:  Procedure Laterality Date   ABDOMINAL HYSTERECTOMY  09/23/2013   ovaries intact.  Dysmennorrhea.  [facility].   CESAREAN SECTION  x 3   1998/2002/2004   NASAL POLYP SURGERY     nasal polypectomy  1994   WISDOM TOOTH EXTRACTION      Family History  Adopted: Yes  Problem Relation Age of Onset   Allergies Daughter    Allergies Son     Social History   Socioeconomic History   Marital status: Divorced    Spouse name: Not on file   Number of children: 3   Years of education: college   Highest education level: Not on file  Occupational History   Occupation: accounting   Occupation: [employer]   Occupation: Medical illustrator  Tobacco Use   Smoking status: Never   Smokeless tobacco: Never  Vaping Use   Vaping Use: Never used  Substance and Sexual Activity   Alcohol use: Not Currently    Comment: moderate   Drug use: No   Sexual activity: Not Currently    Partners: Male    Birth control/protection: Other-see comments, None    Comment: 1 partner  Other Topics Concern   Not on file  Social History Narrative   Marital status:  Divorced in 04/2017; second marriage.   Not dating.      Children: 3  children (10, 13, 16); 2 stepchildren (18, 14)      Lives: with 2 biological children; autistic daughter with husband.      Employment:  Terminated in 04/2016; [employer] accounting x 25 years.      Tobacco: none      Alcohol: socially/weekends   Patient was adopted; Family History unknown.   Exercise: Light;2 x week, cycling; her and her husband got bikes for christmas.   Social Determinants of Health   Financial Resource Strain: Not on file  Food Insecurity: Not on file  Transportation Needs: Not on file  Physical Activity: Not on file  Stress: Not on file  Social Connections: Not on file  Intimate Partner Violence: Not on file    Outpatient Medications Prior to Visit  Medication Sig Dispense Refill   metFORMIN (GLUCOPHAGE) 1000 MG tablet Take 1 tablet (1,000 mg total) by mouth 2 (two) times daily with a meal. 180 tablet 3   Multiple Vitamins-Minerals (CENTRUM ADULTS) TABS Take 1 capsule by mouth daily.     pravastatin (PRAVACHOL) 40 MG tablet Take 1 tablet (40 mg total) by mouth daily. 90 tablet 3   carvedilol (  COREG) 3.125 MG tablet Take 1 tablet (3.125 mg total) by mouth 2 (two) times daily with a meal. 180 tablet 1   glimepiride (AMARYL) 2 MG tablet Take 2 tablets (4 mg total) by mouth in the morning and at bedtime. 60 tablet 3   blood glucose meter kit and supplies Dispense based on patient and insurance preference. Use up to four times daily as directed. (FOR ICD-10 E10.9, E11.9). 1 each 6   blood glucose meter kit and supplies Dispense based on patient and insurance preference. Use up to four times daily as directed. (FOR ICD-10 E10.9, E11.9). 1 each 0   cetirizine (ZYRTEC) 10 MG tablet Take 10 mg by mouth daily.     valACYclovir (VALTREX) 1000 MG tablet Take 2 tablets (2,000 mg total) by mouth 2 (two) times daily. 30 tablet 1   albuterol (PROVENTIL HFA;VENTOLIN HFA) 108 (90 Base) MCG/ACT inhaler Inhale 2 puffs into the lungs every 6 (six) hours as needed for  wheezing or shortness of breath. 1 Inhaler 2   cholecalciferol (VITAMIN D) 1000 UNITS tablet Take 1,000 Units by mouth daily.     fish oil-omega-3 fatty acids 1000 MG capsule Take 2 g by mouth daily.     fluticasone (VERAMYST) 27.5 MCG/SPRAY nasal spray Place 2 sprays into the nose daily. 10 g 11   No facility-administered medications prior to visit.    Allergies  Allergen Reactions   Lisinopril Cough   Celebrex [Celecoxib] Rash    Per patient from head to toes    ROS Review of Systems    Objective:    Physical Exam  BP (!) 139/93 (BP Location: Right Arm, Patient Position: Sitting, Cuff Size: Normal)   Pulse 82   Temp 98.8 F (37.1 C) (Oral)   Ht '5\' 3"'  (1.6 m)   Wt 176 lb 12.8 oz (80.2 kg)   LMP 07/01/2013   SpO2 98%   BMI 31.32 kg/m  Wt Readings from Last 3 Encounters:  03/20/21 176 lb 12.8 oz (80.2 kg)  04/15/17 169 lb (76.7 kg)  02/02/16 152 lb (68.9 kg)     Health Maintenance Due  Topic Date Due   COVID-19 Vaccine (1) Never done   Hepatitis C Screening  Never done   COLONOSCOPY (Pts 45-10yr Insurance coverage will need to be confirmed)  Never done   PAP SMEAR-Modifier  08/15/2014   Pneumococcal Vaccine [id number] old (2 - PCV) 02/01/2017   Zoster Vaccines- Shingrix (1 of 2) Never done   OPHTHALMOLOGY EXAM  08/23/2017   MAMMOGRAM  09/26/2017   FOOT EXAM  04/15/2018   URINE MICROALBUMIN  04/15/2018   TETANUS/TDAP  05/16/2018   INFLUENZA VACCINE  12/14/2020    There are no preventive care reminders to display for this patient.  Lab Results  Component Value Date   TSH 1.810 08/19/2019   Lab Results  Component Value Date   WBC 6.3 04/15/2017   HGB 13.7 04/15/2017   HCT 40.5 04/15/2017   MCV 88 04/15/2017   PLT 200 04/15/2017   Lab Results  Component Value Date   NA 143 08/26/2020   K 4.7 08/26/2020   CO2 20 08/26/2020   GLUCOSE 294 (H) 08/26/2020   BUN 9 08/26/2020   CREATININE 0.60 08/26/2020   BILITOT 0.2 08/26/2020   ALKPHOS 111  08/26/2020   AST 14 08/26/2020   ALT 24 08/26/2020   PROT 6.9 08/26/2020   ALBUMIN 4.4 08/26/2020   CALCIUM 9.1 08/26/2020   ANIONGAP 6 (L) 09/18/2013  EGFR 107 08/26/2020   Lab Results  Component Value Date   CHOL 169 01/01/2021   Lab Results  Component Value Date   HDL 45 01/01/2021   Lab Results  Component Value Date   LDLCALC 92 01/01/2021   Lab Results  Component Value Date   TRIG 186 (H) 01/01/2021   Lab Results  Component Value Date   CHOLHDL 3.8 04/15/2017   Lab Results  Component Value Date   HGBA1C 10.2 (H) 01/01/2021      Assessment & Plan:   HTN - refill carvedilol. Check bmet prior to next f/u Diabetes - adding lantus 5 units daily. Continue metformin and glimepiride. Check A1c prior to next f/u HLD - continue pravastatin Vitamin D - check vitamin d levels prior to next f/u  Problem List Items Addressed This Visit     Diabetes ([place])   Relevant Medications   glimepiride (AMARYL) 2 MG tablet   Primary hypertension - Primary   Relevant Medications   carvedilol (COREG) 3.125 MG tablet   Pure hypercholesterolemia   Relevant Medications   carvedilol (COREG) 3.125 MG tablet    Meds ordered this encounter  Medications   carvedilol (COREG) 3.125 MG tablet    Sig: Take 1 tablet (3.125 mg total) by mouth 2 (two) times daily with a meal.    Dispense:  180 tablet    Refill:  1   glimepiride (AMARYL) 2 MG tablet    Sig: Take 1 tablet (2 mg total) by mouth in the morning and at bedtime.    Dispense:  180 tablet    Refill:  1     Follow-up: Return in about 1 month (around 04/19/2021) for diabetes, HTN, HLD, Vit D; review labs.  Labs to be drawn prior to next appointment I 1 month  Barbara Henson Dagoberto Ligas, NP

## 2021-04-30 ENCOUNTER — Other Ambulatory Visit: Payer: Self-pay | Admitting: Adult Health

## 2021-04-30 ENCOUNTER — Telehealth: Payer: Self-pay

## 2021-05-01 ENCOUNTER — Ambulatory Visit: Payer: Self-pay | Admitting: Adult Health

## 2021-05-01 LAB — COMPREHENSIVE METABOLIC PANEL
ALT: 27 IU/L (ref 0–32)
AST: 25 IU/L (ref 0–40)
Albumin/Globulin Ratio: 2.1 (ref 1.2–2.2)
Albumin: 4.5 g/dL (ref 3.8–4.9)
Alkaline Phosphatase: 84 IU/L (ref 44–121)
BUN/Creatinine Ratio: 11 (ref 9–23)
BUN: 7 mg/dL (ref 6–24)
Bilirubin Total: 0.4 mg/dL (ref 0.0–1.2)
CO2: 24 mmol/L (ref 20–29)
Calcium: 9.2 mg/dL (ref 8.7–10.2)
Chloride: 101 mmol/L (ref 96–106)
Creatinine, Ser: 0.61 mg/dL (ref 0.57–1.00)
Globulin, Total: 2.1 g/dL (ref 1.5–4.5)
Glucose: 201 mg/dL — ABNORMAL HIGH (ref 70–99)
Potassium: 5.1 mmol/L (ref 3.5–5.2)
Sodium: 140 mmol/L (ref 134–144)
Total Protein: 6.6 g/dL (ref 6.0–8.5)
eGFR: 106 mL/min/{1.73_m2} (ref >59)

## 2021-05-01 LAB — HGB A1C W/O EAG: Hgb A1c MFr Bld: 7.5 % — ABNORMAL HIGH (ref 4.8–5.6)

## 2021-05-22 ENCOUNTER — Ambulatory Visit: Payer: Self-pay | Admitting: Adult Health

## 2021-05-22 ENCOUNTER — Ambulatory Visit: Payer: Self-pay

## 2021-05-22 ENCOUNTER — Ambulatory Visit: Payer: Self-pay | Admitting: Internal Medicine

## 2021-06-19 ENCOUNTER — Ambulatory Visit: Payer: Self-pay | Admitting: Internal Medicine

## 2021-06-19 ENCOUNTER — Encounter: Payer: Self-pay | Admitting: Internal Medicine

## 2021-06-19 ENCOUNTER — Other Ambulatory Visit: Payer: Self-pay

## 2021-06-19 VITALS — BP 138/80 | HR 77 | Temp 97.6°F | Ht 64.0 in | Wt 181.2 lb

## 2021-06-19 DIAGNOSIS — I10 Essential (primary) hypertension: Secondary | ICD-10-CM

## 2021-06-19 DIAGNOSIS — E782 Mixed hyperlipidemia: Secondary | ICD-10-CM

## 2021-06-19 DIAGNOSIS — E119 Type 2 diabetes mellitus without complications: Secondary | ICD-10-CM

## 2021-06-19 MED ORDER — LOSARTAN POTASSIUM 25 MG PO TABS
25.0000 mg | ORAL_TABLET | Freq: Every day | ORAL | 0 refills | Status: DC
  Filled 2021-06-19: qty 30, 30d supply, fill #0

## 2021-06-19 MED ORDER — INSULIN ASPART PROT & ASPART (70-30 MIX) 100 UNIT/ML ~~LOC~~ SUSP: 10.0000 [IU] | Freq: Every day | SUBCUTANEOUS | Status: DC

## 2021-06-19 NOTE — Progress Notes (Signed)
Established Patient Office Visit  Subjective:  Patient ID: Barbara Henson, female    DOB: 12-Jul-1965  Age: 56 y.o. MRN: [id number]  CC:  Chief Complaint  Patient presents with   Follow-up    HPI Barbara Henson presents for f/u, she was given sample for Lantus solo star 5 units daily along with oral meds. She ate breakfast and FSBG 180 in the clinic.  Past Medical History:  Diagnosis Date   Allergy    Anemia    Asthma    Cardiomyopathy, primary ([place]) 08/28/2006   released from cardiology 2011   Diabetes mellitus without complication (HCC)    Excessive menstruation    Heart murmur    past   Herpes labialis    Hyperlipidemia    Lipoma of abdominal wall    Microalbuminuria    Premature beats    Unspecified vitamin D deficiency     Past Surgical History:  Procedure Laterality Date   ABDOMINAL HYSTERECTOMY  09/23/2013   ovaries intact.  Dysmennorrhea.  [facility].   CESAREAN SECTION  x 3   1998/2002/2004   NASAL POLYP SURGERY     nasal polypectomy  1994   WISDOM TOOTH EXTRACTION      Family History  Adopted: Yes  Problem Relation Age of Onset   Allergies Daughter    Allergies Son     Social History   Socioeconomic History   Marital status: Divorced    Spouse name: Not on file   Number of children: 3   Years of education: college   Highest education level: Not on file  Occupational History   Occupation: accounting   Occupation: [employer]   Occupation: Medical illustrator  Tobacco Use   Smoking status: Never   Smokeless tobacco: Never  Vaping Use   Vaping Use: Never used  Substance and Sexual Activity   Alcohol use: Not Currently    Comment: moderate   Drug use: No   Sexual activity: Not Currently    Partners: Male    Birth control/protection: Other-see comments, None    Comment: 1 partner  Other Topics Concern   Not on file  Social History Narrative   Marital status:  Divorced in 04/2017; second marriage.   Not dating.      Children: 3  children (10, 13, 16); 2 stepchildren (18, 71)      Lives: with 2 biological children; autistic daughter with husband.      Employment:  Terminated in 04/2016; [employer] accounting x 25 years.      Tobacco: none      Alcohol: socially/weekends   Patient was adopted; Family History unknown.   Exercise: Light;2 x week, cycling; her and her husband got bikes for christmas.   Social Determinants of Health   Financial Resource Strain: Not on file  Food Insecurity: Not on file  Transportation Needs: Not on file  Physical Activity: Not on file  Stress: Not on file  Social Connections: Not on file  Intimate Partner Violence: Not on file    Outpatient Medications Prior to Visit  Medication Sig Dispense Refill   blood glucose meter kit and supplies Dispense based on patient and insurance preference. Use up to four times daily as directed. (FOR ICD-10 E10.9, E11.9). 1 each 6   blood glucose meter kit and supplies Dispense based on patient and insurance preference. Use up to four times daily as directed. (FOR ICD-10 E10.9, E11.9). 1 each 0   carvedilol (COREG) 3.125  MG tablet Take 1 tablet (3.125 mg total) by mouth 2 (two) times daily with a meal. 180 tablet 1   cetirizine (ZYRTEC) 10 MG tablet Take 10 mg by mouth daily.     glimepiride (AMARYL) 2 MG tablet Take 1 tablet (2 mg total) by mouth in the morning and at bedtime. 180 tablet 1   metFORMIN (GLUCOPHAGE) 1000 MG tablet Take 1 tablet (1,000 mg total) by mouth 2 (two) times daily with a meal. 180 tablet 3   Multiple Vitamins-Minerals (CENTRUM ADULTS) TABS Take 1 capsule by mouth daily.     pravastatin (PRAVACHOL) 40 MG tablet Take 1 tablet (40 mg total) by mouth daily. 90 tablet 3   valACYclovir (VALTREX) 1000 MG tablet Take 2 tablets (2,000 mg total) by mouth 2 (two) times daily. 30 tablet 1   No facility-administered medications prior to visit.    Allergies  Allergen Reactions   Lisinopril Cough   Celebrex [Celecoxib]  Rash    Per patient from head to toes    ROS Review of Systems  All other systems reviewed and are negative.    Objective:    Physical Exam Constitutional:      Appearance: She is obese.  HENT:     Head: Normocephalic and atraumatic.     Mouth/Throat:     Mouth: Mucous membranes are moist.  Cardiovascular:     Rate and Rhythm: Normal rate and regular rhythm.  Pulmonary:     Effort: Pulmonary effort is normal.     Breath sounds: Normal breath sounds.  Abdominal:     General: Bowel sounds are normal.  Skin:    General: Skin is warm and dry.  Neurological:     General: No focal deficit present.    BP 138/80 (BP Location: Right Arm, Patient Position: Sitting, Cuff Size: Normal)    Pulse 77    Temp 97.6 F (36.4 C) (Skin)    Ht 5' 4" (1.626 m)    Wt 181 lb 3.2 oz (82.2 kg)    LMP 07/01/2013    SpO2 98%    BMI 31.10 kg/m  Wt Readings from Last 3 Encounters:  06/19/21 181 lb 3.2 oz (82.2 kg)  03/20/21 176 lb 12.8 oz (80.2 kg)  04/15/17 169 lb (76.7 kg)     Health Maintenance Due  Topic Date Due   COVID-19 Vaccine (1) Never done   Hepatitis C Screening  Never done   COLONOSCOPY (Pts 45-68yr Insurance coverage will need to be confirmed)  Never done   PAP SMEAR-Modifier  08/15/2014   Zoster Vaccines- Shingrix (1 of 2) Never done   OPHTHALMOLOGY EXAM  08/23/2017   MAMMOGRAM  09/26/2017   FOOT EXAM  04/15/2018   URINE MICROALBUMIN  04/15/2018   TETANUS/TDAP  05/16/2018    There are no preventive care reminders to display for this patient.  Lab Results  Component Value Date   TSH 1.810 08/19/2019   Lab Results  Component Value Date   WBC 6.3 04/15/2017   HGB 13.7 04/15/2017   HCT 40.5 04/15/2017   MCV 88 04/15/2017   PLT 200 04/15/2017   Lab Results  Component Value Date   NA 140 04/30/2021   K 5.1 04/30/2021   CO2 24 04/30/2021   GLUCOSE 201 (H) 04/30/2021   BUN 7 04/30/2021   CREATININE 0.61 04/30/2021   BILITOT 0.4 04/30/2021   ALKPHOS 84  04/30/2021   AST 25 04/30/2021   ALT 27 04/30/2021   PROT 6.6 04/30/2021  ALBUMIN 4.5 04/30/2021   CALCIUM 9.2 04/30/2021   ANIONGAP 6 (L) 09/18/2013   EGFR 106 04/30/2021   Lab Results  Component Value Date   CHOL 169 01/01/2021   Lab Results  Component Value Date   HDL 45 01/01/2021   Lab Results  Component Value Date   LDLCALC 92 01/01/2021   Lab Results  Component Value Date   TRIG 186 (H) 01/01/2021   Lab Results  Component Value Date   CHOLHDL 3.8 04/15/2017   Lab Results  Component Value Date   HGBA1C 7.5 (H) 04/30/2021      Assessment & Plan:   Problem List Items Addressed This Visit       Cardiovascular and Mediastinum   Primary hypertension   Relevant Medications   losartan (COZAAR) 25 MG tablet     Endocrine   Type 2 diabetes mellitus without complication, without long-term current use of insulin (HCC) - Primary   Relevant Medications   insulin aspart protamine- aspart (NOVOLOG MIX 70/30) injection 10 Units (Start on 06/20/2021  8:00 AM)   losartan (COZAAR) 25 MG tablet     Other   Mixed hyperlipidemia   Relevant Medications   losartan (COZAAR) 25 MG tablet   Type 2 DM- On Metformin, glimepiride and Lantus. A1c 7.5 was when started insulin. Check A1c next month  Hyperlipidemia- On pravastatin  HTN- On low dose coreg for PVC's, had cough with ACEI. Started on low dose Cozaar. F/U next month.  Meds ordered this encounter  Medications   insulin aspart protamine- aspart (NOVOLOG MIX 70/30) injection 10 Units   losartan (COZAAR) 25 MG tablet    Sig: Take 1 tablet (25 mg total) by mouth daily.    Dispense:  30 tablet    Refill:  0    Follow-up: Return in about 6 weeks (around 07/31/2021).    Wallene Huh, MD

## 2021-06-21 ENCOUNTER — Other Ambulatory Visit: Payer: Self-pay

## 2021-06-21 MED ORDER — NOVOLOG MIX 70/30 (70-30) 100 UNIT/ML ~~LOC~~ SUSP
100.0000 [IU] | Freq: Every morning | SUBCUTANEOUS | 1 refills | Status: DC
  Filled 2021-06-21: qty 60, 60d supply, fill #0

## 2021-06-21 MED ORDER — PRAVASTATIN SODIUM 40 MG PO TABS
40.0000 mg | ORAL_TABLET | Freq: Every day | ORAL | 3 refills | Status: DC
  Filled 2021-06-21: qty 30, 30d supply, fill #0
  Filled 2021-07-06: qty 90, 90d supply, fill #0
  Filled 2021-08-11 – 2021-11-06 (×2): qty 90, 90d supply, fill #1
  Filled 2021-11-08: qty 90, 90d supply, fill #0

## 2021-06-21 MED ORDER — INSULIN ASPART PROT & ASPART (70-30 MIX) 100 UNIT/ML ~~LOC~~ SUSP: 10.0000 [IU] | Freq: Every day | SUBCUTANEOUS | Status: DC

## 2021-06-21 MED ORDER — GLIMEPIRIDE 2 MG PO TABS
2.0000 mg | ORAL_TABLET | Freq: Every day | ORAL | 3 refills | Status: DC
Start: 2021-06-21 — End: 2021-07-31
  Filled 2021-06-21: qty 30, 30d supply, fill #0

## 2021-06-21 MED ORDER — CARVEDILOL 3.125 MG PO TABS
3.1250 mg | ORAL_TABLET | Freq: Two times a day (BID) | ORAL | 3 refills | Status: DC
  Filled 2021-06-21: qty 60, 30d supply, fill #0

## 2021-06-21 MED ORDER — METFORMIN HCL 1000 MG PO TABS
1000.0000 mg | ORAL_TABLET | Freq: Two times a day (BID) | ORAL | 3 refills | Status: DC
  Filled 2021-06-21: qty 60, 30d supply, fill #0
  Filled 2021-08-11: qty 60, 30d supply, fill #1
  Filled 2021-09-14: qty 180, 90d supply, fill #2
  Filled 2021-12-07: qty 180, 90d supply, fill #0
  Filled 2021-12-07: qty 180, 90d supply, fill #3

## 2021-06-21 NOTE — Addendum Note (Signed)
Addended by: Tyler Pita on: 06/21/2021 05:34 PM   Modules accepted: Orders

## 2021-06-21 NOTE — Addendum Note (Signed)
Addended by: Ila Mcgill on: 06/21/2021 05:58 PM   Modules accepted: Orders

## 2021-06-22 ENCOUNTER — Other Ambulatory Visit: Payer: Self-pay

## 2021-06-28 ENCOUNTER — Other Ambulatory Visit: Payer: Self-pay

## 2021-06-28 MED ORDER — INSULIN ASPART 100 UNIT/ML IJ SOLN
10.0000 [IU] | Freq: Three times a day (TID) | INTRAMUSCULAR | 99 refills | Status: DC
  Filled 2021-06-28: qty 10, 34d supply, fill #0

## 2021-06-28 NOTE — Addendum Note (Signed)
Addended by: Tyler Pita on: 06/28/2021 02:51 PM   Modules accepted: Orders

## 2021-06-29 ENCOUNTER — Other Ambulatory Visit: Payer: Self-pay

## 2021-07-06 ENCOUNTER — Other Ambulatory Visit: Payer: Self-pay

## 2021-07-09 ENCOUNTER — Other Ambulatory Visit: Payer: Self-pay

## 2021-07-09 ENCOUNTER — Ambulatory Visit: Payer: Self-pay | Admitting: Pharmacy Technician

## 2021-07-09 DIAGNOSIS — Z79899 Other long term (current) drug therapy: Secondary | ICD-10-CM

## 2021-07-09 NOTE — Progress Notes (Signed)
Completed [facility] application and contract.  Patient agreed to all terms of the [facility] contract.   ? ?Patient approved to receive medication assistance at [facility] until time for re-certification in 2024, and as long as eligibility criteria continues to be met.   ? ?Provided patient with community resource material based on her particular needs.   ? ?Tally Mckinnon J. Ardeth Repetto ?Care Manager ?[facility] ?

## 2021-07-12 ENCOUNTER — Other Ambulatory Visit: Payer: Self-pay

## 2021-07-17 ENCOUNTER — Ambulatory Visit: Payer: Self-pay

## 2021-07-17 MED ORDER — INSULIN ASPART PROT & ASPART (70-30 MIX) 100 UNIT/ML ~~LOC~~ SUSP
10.0000 [IU] | Freq: Every day | SUBCUTANEOUS | 0 refills | Status: DC
  Filled 2021-07-17: qty 3, 30d supply, fill #0

## 2021-07-17 NOTE — Addendum Note (Signed)
Addended by: Charolette Forward on: 07/17/2021 03:19 PM   Modules accepted: Orders

## 2021-07-19 ENCOUNTER — Other Ambulatory Visit: Payer: Self-pay

## 2021-07-20 ENCOUNTER — Other Ambulatory Visit: Payer: Self-pay

## 2021-07-29 ENCOUNTER — Other Ambulatory Visit: Payer: Self-pay | Admitting: Adult Health

## 2021-07-30 LAB — LIPID PANEL WITH LDL/HDL RATIO
Cholesterol, Total: 179 mg/dL (ref 100–199)
HDL: 57 mg/dL (ref >39)
LDL Chol Calc (NIH): 92 mg/dL (ref 0–99)
LDL/HDL Ratio: 1.6 ratio (ref 0.0–3.2)
Triglycerides: 176 mg/dL — ABNORMAL HIGH (ref 0–149)
VLDL Cholesterol Cal: 30 mg/dL (ref 5–40)

## 2021-07-30 LAB — COMPREHENSIVE METABOLIC PANEL
ALT: 28 IU/L (ref 0–32)
AST: 22 IU/L (ref 0–40)
Albumin/Globulin Ratio: 2.5 — ABNORMAL HIGH (ref 1.2–2.2)
Albumin: 4.8 g/dL (ref 3.8–4.9)
Alkaline Phosphatase: 87 IU/L (ref 44–121)
BUN/Creatinine Ratio: 12 (ref 9–23)
BUN: 8 mg/dL (ref 6–24)
Bilirubin Total: 0.3 mg/dL (ref 0.0–1.2)
CO2: 23 mmol/L (ref 20–29)
Calcium: 9.7 mg/dL (ref 8.7–10.2)
Chloride: 99 mmol/L (ref 96–106)
Creatinine, Ser: 0.65 mg/dL (ref 0.57–1.00)
Globulin, Total: 1.9 g/dL (ref 1.5–4.5)
Glucose: 180 mg/dL — ABNORMAL HIGH (ref 70–99)
Potassium: 4.3 mmol/L (ref 3.5–5.2)
Sodium: 137 mmol/L (ref 134–144)
Total Protein: 6.7 g/dL (ref 6.0–8.5)
eGFR: 105 mL/min/{1.73_m2} (ref >59)

## 2021-07-30 LAB — HGB A1C W/O EAG: Hgb A1c MFr Bld: 7.8 % — ABNORMAL HIGH (ref 4.8–5.6)

## 2021-07-31 ENCOUNTER — Ambulatory Visit: Payer: Self-pay

## 2021-07-31 ENCOUNTER — Other Ambulatory Visit: Payer: Self-pay

## 2021-07-31 ENCOUNTER — Ambulatory Visit: Payer: Self-pay | Admitting: Internal Medicine

## 2021-07-31 MED ORDER — GLIMEPIRIDE 2 MG PO TABS
2.0000 mg | ORAL_TABLET | Freq: Two times a day (BID) | ORAL | 3 refills | Status: DC
  Filled 2021-07-31: qty 60, 30d supply, fill #0
  Filled 2021-09-07: qty 60, 30d supply, fill #1
  Filled 2021-11-06: qty 60, 30d supply, fill #2
  Filled 2021-11-08: qty 60, 30d supply, fill #0
  Filled 2021-12-25: qty 60, 30d supply, fill #1
  Filled 2022-02-14: qty 60, 30d supply, fill #2

## 2021-07-31 MED ORDER — GLIMEPIRIDE 2 MG PO TABS
2.0000 mg | ORAL_TABLET | Freq: Every day | ORAL | 3 refills | Status: DC
  Filled 2021-07-31: qty 30, 30d supply, fill #0

## 2021-08-02 ENCOUNTER — Other Ambulatory Visit: Payer: Self-pay

## 2021-08-04 ENCOUNTER — Telehealth: Payer: Self-pay | Admitting: Pharmacist

## 2021-08-04 NOTE — Telephone Encounter (Signed)
08/04/2021 3:26:40 PM - Lantus faxed to [facility] ?-- Arletha Pili - Wednesday, August 04, 2021 3:25 PM -- ?Faxed new pt enrollment for Lantus to [facility] ? ?

## 2021-08-11 ENCOUNTER — Other Ambulatory Visit: Payer: Self-pay

## 2021-08-13 ENCOUNTER — Other Ambulatory Visit: Payer: Self-pay

## 2021-08-14 ENCOUNTER — Ambulatory Visit: Payer: Self-pay | Admitting: Internal Medicine

## 2021-08-16 ENCOUNTER — Other Ambulatory Visit: Payer: Self-pay

## 2021-08-17 ENCOUNTER — Other Ambulatory Visit: Payer: Self-pay

## 2021-08-25 ENCOUNTER — Other Ambulatory Visit: Payer: Self-pay

## 2021-08-28 ENCOUNTER — Encounter: Payer: Self-pay | Admitting: Internal Medicine

## 2021-08-28 ENCOUNTER — Ambulatory Visit: Payer: Self-pay | Admitting: Internal Medicine

## 2021-08-28 VITALS — BP 131/78 | HR 89 | Temp 98.5°F | Resp 16 | Ht 63.0 in | Wt 176.4 lb

## 2021-08-28 DIAGNOSIS — E782 Mixed hyperlipidemia: Secondary | ICD-10-CM

## 2021-08-28 DIAGNOSIS — I10 Essential (primary) hypertension: Secondary | ICD-10-CM

## 2021-08-28 DIAGNOSIS — E119 Type 2 diabetes mellitus without complications: Secondary | ICD-10-CM

## 2021-08-28 MED ORDER — LANTUS SOLOSTAR 100 UNIT/ML ~~LOC~~ SOPN
10.0000 [IU] | PEN_INJECTOR | Freq: Every day | SUBCUTANEOUS | 99 refills | Status: DC
  Filled 2021-08-28: qty 45, fill #0
  Filled 2021-09-16: qty 15, 150d supply, fill #0
  Filled 2021-09-23: qty 15, 140d supply, fill #0

## 2021-08-28 NOTE — Progress Notes (Signed)
? ?Internal MEDICINE  ?Office Visit Note ? ?Patient Name: LILLIANAH SWARTZENTRUBER ? [id number]  ?[id number] ? ?Date of Service: 08/28/2021 ? ?No chief complaint on file. ? ? ?HPI ? ?HTN- ok control ?DM- high AIC 7.8 - lantus  insulin - started 2 month ago- 5 units; metfomin and glimepiride ?BS reading from home-  120-150 ?HLP  ? ?Pt is here for routine follow up.  ? ? ?Current Medication: ?Outpatient Encounter Medications as of 08/28/2021  ?Medication Sig Note  ? blood glucose meter kit and supplies Dispense based on patient and insurance preference. Use up to four times daily as directed. (FOR ICD-10 E10.9, E11.9).   ? blood glucose meter kit and supplies Dispense based on patient and insurance preference. Use up to four times daily as directed. (FOR ICD-10 E10.9, E11.9).   ? carvedilol (COREG) 3.125 MG tablet Take 1 tablet (3.125 mg total) by mouth 2 (two) times daily with a meal.   ? cetirizine (ZYRTEC) 10 MG tablet Take 10 mg by mouth daily. 03/12/2014: Per patient as needed  ? glimepiride (AMARYL) 2 MG tablet Take 1 tablet by mouth 2 (two) times daily.   ? insulin aspart (NOVOLOG) 100 UNIT/ML injection Inject 10 Units into the skin 3 (three) times daily before meals.   ? insulin aspart protamine- aspart (NOVOLOG MIX 70/30) (70-30) 100 UNIT/ML injection Inject 0.1 mLs (10 Units total) into the skin once daily with breakfast.   ? losartan (COZAAR) 25 MG tablet Take 1 tablet (25 mg total) by mouth once daily.   ? metFORMIN (GLUCOPHAGE) 1000 MG tablet Take 1 tablet (1,000 mg total) by mouth 2 (two) times daily with a meal.   ? Multiple Vitamins-Minerals (CENTRUM ADULTS) TABS Take 1 capsule by mouth daily.   ? pravastatin (PRAVACHOL) 40 MG tablet Take 1 tablet (40 mg total) by mouth once daily.   ? valACYclovir (VALTREX) 1000 MG tablet Take 2 tablets (2,000 mg total) by mouth 2 (two) times daily.   ? ?No facility-administered encounter medications on file as of 08/28/2021.  ? ? ?Surgical History: ?Past Surgical History:  ?Procedure  Laterality Date  ? ABDOMINAL HYSTERECTOMY  09/23/2013  ? ovaries intact.  D[facility]menn[facility]hea.  [facility].  ? CESAREAN SECTION  x 3  ? 1998/2002/2004  ? NASAL POLYP SURGERY    ? nasal polypectomy  1994  ? WISDOM TOOTH EXTRACTION    ? ? ?Medical History: ?Past Medical History:  ?Diagnosis Date  ? Allergy   ? Anemia   ? Asthma   ? Cardiomyopathy, primary ([place]) 08/28/2006  ? released from cardiology 2011  ? Diabetes mellitus without complication ([place])   ? Excessive menstruation   ? Heart murmur   ? past  ? Herpes labialis   ? Hyperlipidemia   ? Lipoma of abdominal wall   ? Microalbuminuria   ? Premature beats   ? Unspecified vitamin D deficiency   ? ? ?Family History: ?Family History  ?Adopted: Yes  ?Problem Relation Age of Onset  ? Allergies Daughter   ? Allergies Son   ? ? ?Social History  ? ?Socioeconomic History  ? Marital status: Divorced  ?  Spouse name: Not on file  ? Number of children: 3  ? Years of education: college  ? Highest education level: Not on file  ?Occupational History  ? Occupation: accounting  ? Occupation: [employer]  ? Occupation: Medical illustrator  ?Tobacco Use  ? Smoking status: Never  ? Smokeless tobacco: Never  ?Vaping Use  ? Vaping Use:  Never used  ?Substance and Sexual Activity  ? Alcohol use: Not Currently  ?  Comment: moderate  ? Drug use: No  ? Sexual activity: Not Currently  ?  Partners: Male  ?  Birth control/protection: Other-see comments, None  ?  Comment: 1 partner  ?Other Topics Concern  ? Not on file  ?Social History Narrative  ? Marital status:  Divorced in 04/2017; second marriage.   Not dating.  ?    Children: 3 children (10, 13, 16); 2 stepchildren (18, 22)  ?    Lives: with 2 biological children; autistic daughter with husband.  ?    Employment:  Terminated in 04/2016; [employer] x 25 years.  ?    Tobacco: none  ?    Alcohol: socially/weekends  ? Patient was adopted; Family History unknown.  ? Exercise: Light;2 x week, cycling; her and her husband got  bikes for christmas.  ? ?Social Determinants of Health  ? ?Financial Resource Strain: Not on file  ?Food Insecurity: Not on file  ?Transportation Needs: Not on file  ?Physical Activity: Not on file  ?Stress: Not on file  ?Social Connections: Not on file  ?Intimate Partner Violence: Not on file  ? ? ? ? ?Review of Systems ? ?Vital Signs: ?BP 131/78 (BP Location: Right Arm, Patient Position: Sitting, Cuff Size: Normal)   Pulse 89   Temp 98.5 ?F (36.9 ?C)   Resp 16   Ht '5\' 3"'  (1.6 m)   Wt 176 lb 6.4 oz (80 kg)   LMP 07/01/2013   SpO2 98%   BMI 31.25 kg/m?  ? ? ?Physical Exam ? ? ? ? ?Assessment/Plan: ?DM-  AIC 7.8-  increase lantus 10 units daily ?HTN- controlled- lab ok ?HLP- lab ok, stay on Statin ? ? ?General Counseling: Philis verbalizes understanding of the findings of todays visit and agrees with plan of treatment. I have discussed any further diagnostic evaluation that may be needed or ordered today. We also reviewed her medications today. she has been encouraged to call the office with any questions or concerns that should arise related to todays visit. ? ? ? ?No orders of the defined types were placed in this encounter. ? ? ?No orders of the defined types were placed in this encounter. ? ? ? ? ? ? ?Yaris Ferrell ?

## 2021-08-28 NOTE — Addendum Note (Signed)
Addended by: Tyler Pita on: 08/28/2021 11:20 AM ? ? Modules accepted: Orders ? ?

## 2021-08-30 ENCOUNTER — Other Ambulatory Visit: Payer: Self-pay

## 2021-09-03 ENCOUNTER — Other Ambulatory Visit: Payer: Self-pay

## 2021-09-07 ENCOUNTER — Other Ambulatory Visit: Payer: Self-pay

## 2021-09-08 ENCOUNTER — Other Ambulatory Visit: Payer: Self-pay

## 2021-09-14 ENCOUNTER — Other Ambulatory Visit: Payer: Self-pay

## 2021-09-15 ENCOUNTER — Other Ambulatory Visit: Payer: Self-pay

## 2021-09-16 ENCOUNTER — Other Ambulatory Visit: Payer: Self-pay

## 2021-09-22 ENCOUNTER — Other Ambulatory Visit: Payer: Self-pay

## 2021-09-23 ENCOUNTER — Other Ambulatory Visit: Payer: Self-pay

## 2021-09-24 ENCOUNTER — Other Ambulatory Visit: Payer: Self-pay

## 2021-09-26 ENCOUNTER — Other Ambulatory Visit: Payer: Self-pay

## 2021-09-26 MED ORDER — LOSARTAN POTASSIUM 25 MG PO TABS
25.0000 mg | ORAL_TABLET | Freq: Every day | ORAL | 3 refills | Status: DC
  Filled 2021-09-26: qty 30, 30d supply, fill #0
  Filled 2021-11-06: qty 30, 30d supply, fill #1
  Filled 2021-11-08: qty 30, 30d supply, fill #0
  Filled 2021-12-27: qty 30, 30d supply, fill #1
  Filled 2022-02-14: qty 30, 30d supply, fill #2

## 2021-09-26 MED ORDER — LOSARTAN POTASSIUM 25 MG PO TABS
25.0000 mg | ORAL_TABLET | Freq: Every day | ORAL | 0 refills | Status: DC
  Filled 2021-09-26: qty 30, 30d supply, fill #0

## 2021-09-27 ENCOUNTER — Other Ambulatory Visit: Payer: Self-pay

## 2021-09-28 ENCOUNTER — Other Ambulatory Visit: Payer: Self-pay

## 2021-09-28 ENCOUNTER — Telehealth: Payer: Self-pay | Admitting: Pharmacist

## 2021-09-28 MED ORDER — NOVOFINE PEN NEEDLE 32G X 6 MM MISC
99 refills | Status: DC
  Filled 2021-09-28: qty 100, 100d supply, fill #0

## 2021-09-28 NOTE — Telephone Encounter (Signed)
09/28/2021 11:19:27 AM - Lantus dose increase refaxed to [facility] ?

## 2021-10-16 ENCOUNTER — Encounter: Payer: Self-pay | Admitting: Internal Medicine

## 2021-10-16 ENCOUNTER — Ambulatory Visit: Payer: Self-pay | Admitting: Internal Medicine

## 2021-10-16 VITALS — BP 134/115 | HR 74 | Temp 98.4°F | Ht 64.57 in | Wt 178.8 lb

## 2021-10-16 DIAGNOSIS — L258 Unspecified contact dermatitis due to other agents: Secondary | ICD-10-CM

## 2021-10-16 DIAGNOSIS — B86 Scabies: Secondary | ICD-10-CM

## 2021-10-16 MED ORDER — PERMETHRIN 5 % EX CREA
1.0000 "application " | TOPICAL_CREAM | Freq: Once | CUTANEOUS | 0 refills | Status: AC
  Filled 2021-10-16: qty 60, 1d supply, fill #0

## 2021-10-16 MED ORDER — BETAMETHASONE DIPROPIONATE AUG 0.05 % EX OINT
TOPICAL_OINTMENT | Freq: Two times a day (BID) | CUTANEOUS | 0 refills | Status: DC
  Filled 2021-10-16: qty 30, 10d supply, fill #0

## 2021-10-16 NOTE — Progress Notes (Signed)
[place], [place] [address]  Internal MEDICINE  Office Visit Note  Patient Name: Barbara Henson  [id number]  [id number]  Date of Service: 10/16/2021  Chief Complaint  Patient presents with   Mouth Lesions    The pt did not have it on her lip but she have it in her hands and legs, and she is requesting a new medication to resolve the symptoms     HPI Patient is here for urgent visit. She is giving history of having some sort of rash on her face after getting make-up done she thinks maybe she had a cold sore as the person who she came in contact with had a cold sore however she denies any lesions around her lip the rash went away after getting treated with antiviral therapy  however recently patient noticed the rash appeared on her left arm at 3 different areas and she thinks that when She scratches it spreads to the other body part she also has a lesion on her left leg she denies any burning however she is having itching.  Denies any fever chills Patient is known diabetic on insulin   Current Medication: Outpatient Encounter Medications as of 10/16/2021  Medication Sig Note   augmented betamethasone dipropionate (DIPROLENE) 0.05 % ointment Apply topically 2 (two) times daily.    permethrin (ELIMITE) 5 % cream Apply 1 application. topically once for 1 dose. Use as directed    blood glucose meter kit and supplies Dispense based on patient and insurance preference. Use up to four times daily as directed. (FOR ICD-10 E10.9, E11.9).    blood glucose meter kit and supplies Dispense based on patient and insurance preference. Use up to four times daily as directed. (FOR ICD-10 E10.9, E11.9).    cetirizine (ZYRTEC) 10 MG tablet Take 10 mg by mouth daily. 03/12/2014: Per patient as needed   glimepiride (AMARYL) 2 MG tablet Take 1 tablet by mouth 2 (two) times daily.    insulin aspart (NOVOLOG) 100 UNIT/ML injection Inject 10 Units into the skin 3 (three) times daily before meals.    insulin  aspart protamine- aspart (NOVOLOG MIX 70/30) (70-30) 100 UNIT/ML injection Inject 0.1 mLs (10 Units total) into the skin once daily with breakfast.    Insulin Pen Needle (NOVOFINE PEN NEEDLE) 32G X 6 MM MISC USE AS DIRECTED    LANTUS SOLOSTAR 100 UNIT/ML Solostar Pen Inject 10 Units into the skin once daily.    losartan (COZAAR) 25 MG tablet Take 1 tablet (25 mg total) by mouth once daily.    metFORMIN (GLUCOPHAGE) 1000 MG tablet Take 1 tablet (1,000 mg total) by mouth 2 (two) times daily with a meal.    Multiple Vitamins-Minerals (CENTRUM ADULTS) TABS Take 1 capsule by mouth daily.    pravastatin (PRAVACHOL) 40 MG tablet Take 1 tablet (40 mg total) by mouth once daily.    valACYclovir (VALTREX) 1000 MG tablet Take 2 tablets (2,000 mg total) by mouth 2 (two) times daily.    [DISCONTINUED] carvedilol (COREG) 3.125 MG tablet Take 1 tablet (3.125 mg total) by mouth 2 (two) times daily with a meal.    No facility-administered encounter medications on file as of 10/16/2021.    Surgical History: Past Surgical History:  Procedure Laterality Date   ABDOMINAL HYSTERECTOMY  09/23/2013   ovaries intact.  Dysmennorrhea.  [facility].   CESAREAN SECTION  x 3   1998/2002/2004   NASAL POLYP SURGERY     nasal polypectomy  1994   WISDOM  TOOTH EXTRACTION      Medical History: Past Medical History:  Diagnosis Date   Allergy    Anemia    Asthma    Cardiomyopathy, primary ([facility]) 08/28/2006   released from cardiology 2011   Diabetes mellitus without complication (HCC)    Excessive menstruation    Heart murmur    past   Herpes labialis    Hyperlipidemia    Lipoma of abdominal wall    Microalbuminuria    Premature beats    Unspecified vitamin D deficiency     Family History: Family History  Adopted: Yes  Problem Relation Age of Onset   Allergies Daughter    Allergies Son     Social History   Socioeconomic History   Marital status: Divorced    Spouse name: Not on file   Number of  children: 3   Years of education: college   Highest education level: Not on file  Occupational History   Occupation: accounting   Occupation: [employer]   Occupation: Medical illustrator  Tobacco Use   Smoking status: Never   Smokeless tobacco: Never  Vaping Use   Vaping Use: Never used  Substance and Sexual Activity   Alcohol use: Not Currently    Comment: moderate   Drug use: No   Sexual activity: Not Currently    Partners: Male    Birth control/protection: Other-see comments, None    Comment: 1 partner  Other Topics Concern   Not on file  Social History Narrative   Marital status:  Divorced in 04/2017; second marriage.   Not dating.      Children: 3 children (10, 13, 16); 2 stepchildren (18, 39)      Lives: with 2 biological children; autistic daughter with husband.      Employment:  Terminated in 04/2016; [employer] x 25 years.      Tobacco: none      Alcohol: socially/weekends   Patient was adopted; Family History unknown.   Exercise: Light;2 x week, cycling; her and her husband got bikes for christmas.   Social Determinants of Health   Financial Resource Strain: Not on file  Food Insecurity: Not on file  Transportation Needs: Not on file  Physical Activity: Not on file  Stress: Not on file  Social Connections: Not on file  Intimate Partner Violence: Not on file      Review of Systems  Constitutional:  Negative for fatigue and fever.  HENT:  Negative for congestion, mouth sores and postnasal drip.   Respiratory:  Negative for cough.   Cardiovascular:  Negative for chest pain.  Genitourinary:  Negative for flank pain.  Skin:  Positive for color change and rash.  Psychiatric/Behavioral: Negative.     Vital Signs: BP (!) 134/115 (BP Location: Left Arm, Patient Position: Sitting, Cuff Size: Normal)   Pulse 74   Temp 98.4 F (36.9 C) (Oral)   Ht 5' 4.57" (1.64 m)   Wt 178 lb 12.8 oz (81.1 kg)   LMP 07/01/2013   SpO2 98%   BMI 30.15  kg/m    Physical Exam Constitutional:      Appearance: Normal appearance.  Skin:    Findings: Erythema, lesion and rash present.     Comments: Patient has squamous pustular rash on her left thigh questionable scabies  Neurological:     Mental Status: She is alert.       Assessment/Plan: 1. Contact dermatitis due to other agent, unspecified contact dermatitis type Patient denies  any exposure to any new agent no history of exposure to poison ivy however will provide and treat with the prednisone if Elimite does not take care of this condition - augmented betamethasone dipropionate (DIPROLENE) 0.05 % ointment; Apply topically 2 (two) times daily.  Dispense: 30 g; Refill: 0  2. Scabies infestation Questionable scabies since patient has applied multiple agents on these lesions it is hard to tell however the left leg and thigh lesion seems to be scabies - permethrin (ELIMITE) 5 % cream; Apply 1 application. topically once for 1 dose. Use as directed  Dispense: 1 g; Refill: 0   General Counseling: Tamie verbalizes understanding of the findings of todays visit and agrees with plan of treatment. I have discussed any further diagnostic evaluation that may be needed or ordered today. We also reviewed her medications today. she has been encouraged to call the office with any questions or concerns that should arise related to todays visit.    No orders of the defined types were placed in this encounter.   Meds ordered this encounter  Medications   augmented betamethasone dipropionate (DIPROLENE) 0.05 % ointment    Sig: Apply topically 2 (two) times daily.    Dispense:  30 g    Refill:  0   permethrin (ELIMITE) 5 % cream    Sig: Apply 1 application. topically once for 1 dose. Use as directed    Dispense:  1 g    Refill:  0    Total time spent:15 Minutes Time spent includes review of chart, medications, test results, and follow up plan with the patient.   [place] Controlled Substance  Database was reviewed by me.   Dr Lavera Guise Internal medicine

## 2021-10-17 ENCOUNTER — Other Ambulatory Visit: Payer: Self-pay

## 2021-10-18 ENCOUNTER — Other Ambulatory Visit: Payer: Self-pay

## 2021-10-27 ENCOUNTER — Other Ambulatory Visit: Payer: Self-pay

## 2021-11-04 ENCOUNTER — Other Ambulatory Visit: Payer: Self-pay

## 2021-11-07 ENCOUNTER — Other Ambulatory Visit: Payer: Self-pay

## 2021-11-08 ENCOUNTER — Other Ambulatory Visit: Payer: Self-pay

## 2021-11-26 ENCOUNTER — Other Ambulatory Visit: Payer: Self-pay | Admitting: Internal Medicine

## 2021-11-27 ENCOUNTER — Ambulatory Visit: Payer: Self-pay | Admitting: Internal Medicine

## 2021-11-27 ENCOUNTER — Encounter: Payer: Self-pay | Admitting: Internal Medicine

## 2021-11-27 VITALS — BP 138/75 | HR 76 | Temp 98.8°F | Ht 63.86 in | Wt 176.1 lb

## 2021-11-27 DIAGNOSIS — E119 Type 2 diabetes mellitus without complications: Secondary | ICD-10-CM

## 2021-11-27 LAB — TSH: TSH: 1.75 u[IU]/mL (ref 0.450–4.500)

## 2021-11-27 LAB — HGB A1C W/O EAG: Hgb A1c MFr Bld: 7.1 % — ABNORMAL HIGH (ref 4.8–5.6)

## 2021-11-27 LAB — VITAMIN D 25 HYDROXY (VIT D DEFICIENCY, FRACTURES): Vit D, 25-Hydroxy: 30.5 ng/mL (ref 30.0–100.0)

## 2021-11-27 NOTE — Progress Notes (Signed)
Internal MEDICINE  Office Visit Note  Patient Name: Barbara Henson  [id number]  [id number]  Date of Service: 11/27/2021  Chief Complaint  Patient presents with   Follow-up    Pt is here for a follow up   Diabetes    Pt is here for a follow up for diabetes    HPI  Pt is here for routine follow up.  DM- AIC 7.1  On lantus/ metformin Some loose stool  Current Medication: Outpatient Encounter Medications as of 11/27/2021  Medication Sig Note   blood glucose meter kit and supplies Dispense based on patient and insurance preference. Use up to four times daily as directed. (FOR ICD-10 E10.9, E11.9).    blood glucose meter kit and supplies Dispense based on patient and insurance preference. Use up to four times daily as directed. (FOR ICD-10 E10.9, E11.9).    glimepiride (AMARYL) 2 MG tablet Take 1 tablet by mouth 2 (two) times daily.    LANTUS SOLOSTAR 100 UNIT/ML Solostar Pen Inject 10 Units into the skin once daily.    losartan (COZAAR) 25 MG tablet Take 1 tablet (25 mg total) by mouth once daily.    metFORMIN (GLUCOPHAGE) 1000 MG tablet Take 1 tablet (1,000 mg total) by mouth 2 (two) times daily with a meal.    Multiple Vitamins-Minerals (CENTRUM ADULTS) TABS Take 1 capsule by mouth daily.    pravastatin (PRAVACHOL) 40 MG tablet Take 1 tablet (40 mg total) by mouth once daily.    augmented betamethasone dipropionate (DIPROLENE) 0.05 % ointment Apply topically 2 (two) times daily. (Patient not taking: Reported on 11/27/2021)    cetirizine (ZYRTEC) 10 MG tablet Take 10 mg by mouth daily. 03/12/2014: Per patient as needed   insulin aspart (NOVOLOG) 100 UNIT/ML injection Inject 10 Units into the skin 3 (three) times daily before meals.    insulin aspart protamine- aspart (NOVOLOG MIX 70/30) (70-30) 100 UNIT/ML injection Inject 0.1 mLs (10 Units total) into the skin once daily with breakfast. (Patient not taking: Reported on 11/27/2021)    Insulin Pen Needle (NOVOFINE PEN NEEDLE) 32G X 6 MM  MISC USE AS DIRECTED (Patient not taking: Reported on 11/27/2021)    valACYclovir (VALTREX) 1000 MG tablet Take 2 tablets (2,000 mg total) by mouth 2 (two) times daily. (Patient not taking: Reported on 11/27/2021)    No facility-administered encounter medications on file as of 11/27/2021.    Surgical History: Past Surgical History:  Procedure Laterality Date   ABDOMINAL HYSTERECTOMY  09/23/2013   ovaries intact.  Dysmennorrhea.  [facility].   CESAREAN SECTION  x 3   1998/2002/2004   NASAL POLYP SURGERY     nasal polypectomy  1994   WISDOM TOOTH EXTRACTION      Medical History: Past Medical History:  Diagnosis Date   Allergy    Anemia    Asthma    Cardiomyopathy, primary ([facility]) 08/28/2006   released from cardiology 2011   Diabetes mellitus without complication (HCC)    Excessive menstruation    Heart murmur    past   Herpes labialis    Hyperlipidemia    Lipoma of abdominal wall    Microalbuminuria    Premature beats    Unspecified vitamin D deficiency     Family History: Family History  Adopted: Yes  Problem Relation Age of Onset   Allergies Daughter    Allergies Son     Social History   Socioeconomic History   Marital status: Divorced    Spouse name:  Not on file   Number of children: 3   Years of education: college   Highest education level: Not on file  Occupational History   Occupation: accounting   Occupation: [employer]   Occupation: Medical illustrator  Tobacco Use   Smoking status: Never   Smokeless tobacco: Never  Vaping Use   Vaping Use: Never used  Substance and Sexual Activity   Alcohol use: Not Currently    Comment: moderate   Drug use: No   Sexual activity: Not Currently    Partners: Male    Birth control/protection: Other-see comments, None    Comment: 1 partner  Other Topics Concern   Not on file  Social History Narrative   Marital status:  Divorced in 04/2017; second marriage.   Not dating.      Children: 3 children (10, 13, 16);  2 stepchildren (18, 64)      Lives: with 2 biological children; autistic daughter with husband.      Employment:  Terminated in 04/2016; [employer] x 25 years.      Tobacco: none      Alcohol: socially/weekends   Patient was adopted; Family History unknown.   Exercise: Light;2 x week, cycling; her and her husband got bikes for christmas.   Social Determinants of Health   Financial Resource Strain: High Risk (04/06/2019)   Overall Financial Resource Strain (CARDIA)    Difficulty of Paying Living Expenses: Hard  Food Insecurity: Food Insecurity Present (04/06/2019)   Hunger Vital Sign    Worried About Running Out of Food in the Last Year: Sometimes true    Ran Out of Food in the Last Year: Sometimes true  Transportation Needs: No Transportation Needs (04/06/2019)   PRAPARE - Hydrologist (Medical): No    Lack of Transportation (Non-Medical): No  Physical Activity: Sufficiently Active (04/06/2019)   Exercise Vital Sign    Days of Exercise per Week: 5 days    Minutes of Exercise per Session: 130 min  Stress: Stress Concern Present (04/06/2019)   [place]    Feeling of Stress : Rather much  Social Connections: Unknown (04/06/2019)   Social Connection and Isolation Panel [NHANES]    Frequency of Communication with Friends and Family: Once a week    Frequency of Social Gatherings with Friends and Family: Never    Attends Religious Services: More than 4 times per year    Active Member of [employer] or Organizations: Not on file    Attends Archivist Meetings: Not on file    Marital Status: Not on file  Intimate Partner Violence: Not on file      Review of Systems  Vital Signs: BP 138/75 (BP Location: Left Arm, Patient Position: Sitting, Cuff Size: Normal)   Pulse 76   Temp 98.8 F (37.1 C)   Ht 5' 3.86" (1.622 m)   Wt 176 lb 2.4 oz (79.9 kg)   LMP  07/01/2013   SpO2 98%   BMI 30.37 kg/m    Physical Exam     Assessment/Plan: DM- AIC 7.1 ok, stay on same med TSH and vit D is ok F/u in 4 month  General Counseling: Barbara Henson verbalizes understanding of the findings of todays visit and agrees with plan of treatment. I have discussed any further diagnostic evaluation that may be needed or ordered today. We also reviewed her medications today. she has been encouraged to call the office  with any questions or concerns that should arise related to todays visit.    No orders of the defined types were placed in this encounter.   No orders of the defined types were placed in this encounter.       Wenda Low

## 2021-12-07 ENCOUNTER — Other Ambulatory Visit: Payer: Self-pay

## 2021-12-08 ENCOUNTER — Other Ambulatory Visit: Payer: Self-pay

## 2021-12-26 ENCOUNTER — Other Ambulatory Visit: Payer: Self-pay

## 2021-12-27 ENCOUNTER — Other Ambulatory Visit: Payer: Self-pay

## 2022-01-13 ENCOUNTER — Other Ambulatory Visit (HOSPITAL_COMMUNITY): Payer: Self-pay

## 2022-02-10 ENCOUNTER — Other Ambulatory Visit: Payer: Self-pay

## 2022-02-14 ENCOUNTER — Other Ambulatory Visit: Payer: Self-pay

## 2022-02-21 ENCOUNTER — Other Ambulatory Visit: Payer: Self-pay

## 2022-02-22 ENCOUNTER — Other Ambulatory Visit: Payer: Self-pay

## 2022-03-10 ENCOUNTER — Other Ambulatory Visit: Payer: Self-pay

## 2022-03-31 ENCOUNTER — Other Ambulatory Visit: Payer: Self-pay | Admitting: Internal Medicine

## 2022-04-01 ENCOUNTER — Other Ambulatory Visit: Payer: Self-pay

## 2022-04-01 LAB — LIPID PANEL W/O CHOL/HDL RATIO
Cholesterol, Total: 177 mg/dL (ref 100–199)
HDL: 45 mg/dL (ref >39)
LDL Chol Calc (NIH): 97 mg/dL (ref 0–99)
Triglycerides: 207 mg/dL — ABNORMAL HIGH (ref 0–149)
VLDL Cholesterol Cal: 35 mg/dL (ref 5–40)

## 2022-04-01 LAB — HGB A1C W/O EAG: Hgb A1c MFr Bld: 7 % — ABNORMAL HIGH (ref 4.8–5.6)

## 2022-04-02 ENCOUNTER — Encounter: Payer: Self-pay | Admitting: Adult Health

## 2022-04-02 ENCOUNTER — Ambulatory Visit: Payer: Self-pay | Admitting: Adult Health

## 2022-04-02 VITALS — BP 117/70 | HR 98 | Temp 98.0°F | Ht 64.0 in | Wt 173.0 lb

## 2022-04-02 DIAGNOSIS — Z8639 Personal history of other endocrine, nutritional and metabolic disease: Secondary | ICD-10-CM

## 2022-04-02 DIAGNOSIS — E119 Type 2 diabetes mellitus without complications: Secondary | ICD-10-CM

## 2022-04-02 MED ORDER — INSULIN DETEMIR 100 UNIT/ML ~~LOC~~ SOLN
10.0000 [IU] | Freq: Every day | SUBCUTANEOUS | 11 refills | Status: DC
  Filled 2022-04-02: qty 10, 100d supply, fill #0

## 2022-04-02 MED ORDER — VALACYCLOVIR HCL 1 G PO TABS
2000.0000 mg | ORAL_TABLET | Freq: Two times a day (BID) | ORAL | 1 refills | Status: AC
  Filled 2022-04-02: qty 30, 8d supply, fill #0

## 2022-04-02 MED ORDER — GLIMEPIRIDE 2 MG PO TABS
2.0000 mg | ORAL_TABLET | Freq: Two times a day (BID) | ORAL | 3 refills | Status: AC
  Filled 2022-04-02: qty 90, 45d supply, fill #0
  Filled 2022-09-05: qty 90, 45d supply, fill #1

## 2022-04-02 MED ORDER — TOUJEO SOLOSTAR 300 UNIT/ML ~~LOC~~ SOPN
10.0000 [IU] | PEN_INJECTOR | Freq: Every day | SUBCUTANEOUS | 3 refills | Status: DC
  Filled 2022-04-02: qty 1.5, fill #0
  Filled 2022-04-04: qty 1.5, 28d supply, fill #0

## 2022-04-02 MED ORDER — METFORMIN HCL 1000 MG PO TABS
1000.0000 mg | ORAL_TABLET | Freq: Two times a day (BID) | ORAL | 3 refills | Status: AC
  Filled 2022-04-02: qty 180, 90d supply, fill #0
  Filled 2022-09-05: qty 180, 90d supply, fill #1

## 2022-04-02 NOTE — Addendum Note (Signed)
Addended by: Charolette Forward on: 04/02/2022 01:38 PM   Modules accepted: Orders

## 2022-04-02 NOTE — Progress Notes (Addendum)
   Established Patient Office Visit  Subjective   Patient ID: Barbara Henson, female    DOB: July 26, 1965  Age: 56 y.o. MRN: [id number]    HPI  F/U labs, diabetes  Past Medical History:  Diagnosis Date   Allergy    Anemia    Asthma    Cardiomyopathy, primary ([facility]) 08/28/2006   released from cardiology 2011   Diabetes mellitus without complication (HCC)    Excessive menstruation    Heart murmur    past   Herpes labialis    Hyperlipidemia    Lipoma of abdominal wall    Microalbuminuria    Premature beats    Unspecified vitamin D deficiency      ROS    Objective:     BP 117/70 (BP Location: Left Arm, Patient Position: Sitting, Cuff Size: Normal)   Pulse 98   Temp 98 F (36.7 C) (Core)   Ht '5\' 4"'$  (1.626 m)   Wt 173 lb (78.5 kg)   LMP 07/01/2013   BMI 29.70 kg/m   BP Readings from Last 3 Encounters:  04/02/22 117/70  11/27/21 138/75  10/16/21 (!) 134/115      Physical Exam   No results found for any visits on 04/02/22.  Last lipids Lab Results  Component Value Date   CHOL 177 03/31/2022   HDL 45 03/31/2022   LDLCALC 97 03/31/2022   TRIG 207 (H) 03/31/2022   CHOLHDL 3.8 04/15/2017   Last hemoglobin A1c Lab Results  Component Value Date   HGBA1C 7.0 (H) 03/31/2022      The 10-year ASCVD risk score (Arnett DK, et al., 2019) is: 4.6%    Assessment & Plan:   1. Type 2 diabetes mellitus without complication, with long-term current use of insulin (HCC) HgbA1c 7.1 Fasting BG 120-150. This morning BG was 150 Continue monitoring blood glucose levels.  Refills on metformin and glimeperide Cannot get lantus or levemir d/t coverage issues Prescribed Toujeo 10 units daily. Diabetic eye exam - 11/2021 RTC in 6 months    2. H/O elevated lipids Continue pravastatin. Increase activity  Triglycerides elevated - avoid sweets  No follow-ups on file.    Shamaria Kavan Dagoberto Ligas, NP

## 2022-04-03 ENCOUNTER — Other Ambulatory Visit: Payer: Self-pay

## 2022-04-04 ENCOUNTER — Other Ambulatory Visit: Payer: Self-pay

## 2022-04-06 ENCOUNTER — Other Ambulatory Visit: Payer: Self-pay

## 2022-04-08 ENCOUNTER — Other Ambulatory Visit: Payer: Self-pay

## 2022-04-14 ENCOUNTER — Other Ambulatory Visit: Payer: Self-pay

## 2022-04-21 ENCOUNTER — Other Ambulatory Visit: Payer: Self-pay

## 2022-04-21 MED ORDER — LANTUS SOLOSTAR 100 UNIT/ML ~~LOC~~ SOPN
10.0000 [IU] | PEN_INJECTOR | Freq: Every day | SUBCUTANEOUS | 11 refills | Status: AC
  Filled 2022-04-21: qty 15, 140d supply, fill #0
  Filled 2022-09-05: qty 3, 30d supply, fill #0
  Filled 2022-09-07: qty 15, 150d supply, fill #0

## 2022-04-21 NOTE — Addendum Note (Signed)
Addended by: Tyler Pita on: 04/21/2022 04:13 PM   Modules accepted: Orders

## 2022-04-27 ENCOUNTER — Other Ambulatory Visit: Payer: Self-pay

## 2022-04-29 ENCOUNTER — Other Ambulatory Visit: Payer: Self-pay

## 2022-06-27 ENCOUNTER — Other Ambulatory Visit: Payer: Self-pay

## 2022-07-22 ENCOUNTER — Other Ambulatory Visit: Payer: Self-pay

## 2022-09-05 ENCOUNTER — Other Ambulatory Visit: Payer: Self-pay

## 2022-09-06 ENCOUNTER — Other Ambulatory Visit: Payer: Self-pay

## 2022-09-07 ENCOUNTER — Other Ambulatory Visit: Payer: Self-pay

## 2022-09-08 ENCOUNTER — Other Ambulatory Visit: Payer: Self-pay

## 2022-09-12 ENCOUNTER — Other Ambulatory Visit: Payer: Self-pay

## 2022-09-15 ENCOUNTER — Other Ambulatory Visit: Payer: Self-pay

## 2022-09-16 ENCOUNTER — Other Ambulatory Visit: Payer: Self-pay | Admitting: Internal Medicine

## 2022-09-16 ENCOUNTER — Other Ambulatory Visit: Payer: Self-pay

## 2022-09-17 ENCOUNTER — Ambulatory Visit: Payer: Self-pay | Admitting: Internal Medicine

## 2022-09-17 ENCOUNTER — Other Ambulatory Visit: Payer: Self-pay

## 2022-09-17 ENCOUNTER — Encounter: Payer: Self-pay | Admitting: Internal Medicine

## 2022-09-17 VITALS — BP 143/85 | HR 67 | Temp 98.6°F | Ht 63.78 in | Wt 166.6 lb

## 2022-09-17 DIAGNOSIS — I10 Essential (primary) hypertension: Secondary | ICD-10-CM

## 2022-09-17 DIAGNOSIS — E782 Mixed hyperlipidemia: Secondary | ICD-10-CM

## 2022-09-17 DIAGNOSIS — E1165 Type 2 diabetes mellitus with hyperglycemia: Secondary | ICD-10-CM

## 2022-09-17 LAB — LIPID PANEL WITH LDL/HDL RATIO
Cholesterol, Total: 212 mg/dL — ABNORMAL HIGH (ref 100–199)
HDL: 53 mg/dL (ref >39)
LDL Chol Calc (NIH): 128 mg/dL — ABNORMAL HIGH (ref 0–99)
LDL/HDL Ratio: 2.4 ratio (ref 0.0–3.2)
Triglycerides: 173 mg/dL — ABNORMAL HIGH (ref 0–149)
VLDL Cholesterol Cal: 31 mg/dL (ref 5–40)

## 2022-09-17 LAB — HGB A1C W/O EAG: Hgb A1c MFr Bld: 11 % — ABNORMAL HIGH (ref 4.8–5.6)

## 2022-09-17 MED ORDER — ATORVASTATIN CALCIUM 10 MG PO TABS
10.0000 mg | ORAL_TABLET | Freq: Every day | ORAL | 3 refills | Status: AC
Start: 2022-09-17 — End: ?

## 2022-09-17 MED ORDER — LOSARTAN POTASSIUM 25 MG PO TABS: 25.0000 mg | ORAL_TABLET | Freq: Every day | ORAL | 3 refills | Status: DC

## 2022-09-17 MED ORDER — NOVOFINE PEN NEEDLE 32G X 6 MM MISC
3 refills | Status: AC
Start: 2022-09-17 — End: ?

## 2022-09-17 NOTE — Progress Notes (Signed)
[facility]   Internal MEDICINE  Office Visit Note  Patient Name: Barbara Henson  [id number]  [id number]  Date of Service: 09/17/2022  Chief Complaint  Patient presents with   Follow-up    Pt si here today for a lab follow and prescription refill     HPI  Pt is here for routine follow up  Diabetes is not under control 2. Pt has not been taking her medications   Current Medication: Outpatient Encounter Medications as of 09/17/2022  Medication Sig Note   atorvastatin (LIPITOR) 10 MG tablet Take 1 tablet (10 mg total) by mouth daily.    blood glucose meter kit and supplies Dispense based on patient and insurance preference. Use up to four times daily as directed. (FOR ICD-10 E10.9, E11.9).    blood glucose meter kit and supplies Dispense based on patient and insurance preference. Use up to four times daily as directed. (FOR ICD-10 E10.9, E11.9).    cetirizine (ZYRTEC) 10 MG tablet Take 10 mg by mouth daily. 03/12/2014: Per patient as needed   glimepiride (AMARYL) 2 MG tablet Take 1 tablet by mouth 2 (two) times daily.    insulin glargine (LANTUS SOLOSTAR) 100 UNIT/ML Solostar Pen Inject 10 Units into the skin daily.    losartan (COZAAR) 25 MG tablet Take 1 tablet (25 mg total) by mouth once daily.    metFORMIN (GLUCOPHAGE) 1000 MG tablet Take 1 tablet (1,000 mg total) by mouth 2 (two) times daily with a meal.    Multiple Vitamins-Minerals (CENTRUM ADULTS) TABS Take 1 capsule by mouth daily.    valACYclovir (VALTREX) 1000 MG tablet Take 2 tablets (2,000 mg total) by mouth 2 (two) times daily.    [DISCONTINUED] augmented betamethasone dipropionate (DIPROLENE) 0.05 % ointment Apply topically 2 (two) times daily. (Patient not taking: Reported on 11/27/2021)    [DISCONTINUED] losartan (COZAAR) 25 MG tablet Take 1 tablet (25 mg total) by mouth once daily.    [DISCONTINUED] pravastatin (PRAVACHOL) 40 MG tablet Take 1 tablet (40 mg total) by mouth once daily.    No facility-administered encounter  medications on file as of 09/17/2022.    Surgical History: Past Surgical History:  Procedure Laterality Date   ABDOMINAL HYSTERECTOMY  09/23/2013   ovaries intact.  Dysmennorrhea.  [facility].   CESAREAN SECTION  x 3   1998/2002/2004   NASAL POLYP SURGERY  05/16/1992   WISDOM TOOTH EXTRACTION      Medical History: Past Medical History:  Diagnosis Date   Allergy    Anemia    Asthma    Cardiomyopathy, primary (HCC) 08/28/2006   released from cardiology 2011   Diabetes mellitus without complication (HCC)    Excessive menstruation    Heart murmur    past   Herpes labialis    Hyperlipidemia    Lipoma of abdominal wall    Microalbuminuria    Premature beats    Unspecified vitamin D deficiency     Family History: Family History  Adopted: Yes  Problem Relation Age of Onset   Allergies Daughter    Allergies Son     Social History   Socioeconomic History   Marital status: Divorced    Spouse name: Not on file   Number of children: 3   Years of education: college   Highest education level: Not on file  Occupational History   Occupation: accounting   Occupation: [employer]   Occupation: Advertising account planner  Tobacco Use   Smoking status: Never   Smokeless tobacco: Never  Vaping Use   Vaping Use: Never used  Substance and Sexual Activity   Alcohol use: Not Currently    Comment: moderate   Drug use: No   Sexual activity: Not Currently    Partners: Male    Birth control/protection: Other-see comments, None    Comment: 1 partner  Other Topics Concern   Not on file  Social History Narrative   Marital status:  Divorced in 04/2017; second marriage.   Not dating.      Children: 3 children (10, 13, 16); 2 stepchildren (18, 15)      Lives: with 2 biological children; autistic daughter with husband.      Employment:  Terminated in 04/2016; [employer] accounting x 25 years.      Tobacco: none      Alcohol: socially/weekends   Patient was adopted; Family  History unknown.   Exercise: Light;2 x week, cycling; her and her husband got bikes for christmas.   Social Determinants of Health   Financial Resource Strain: High Risk (04/06/2019)   Overall Financial Resource Strain (CARDIA)    Difficulty of Paying Living Expenses: Hard  Food Insecurity: Food Insecurity Present (04/06/2019)   Hunger Vital Sign    Worried About Running Out of Food in the Last Year: Sometimes true    Ran Out of Food in the Last Year: Sometimes true  Transportation Needs: No Transportation Needs (04/06/2019)   PRAPARE - Administrator, Civil Service (Medical): No    Lack of Transportation (Non-Medical): No  Physical Activity: Sufficiently Active (04/06/2019)   Exercise Vital Sign    Days of Exercise per Week: 5 days    Minutes of Exercise per Session: 130 min  Stress: Stress Concern Present (04/06/2019)   [employer] of Occupational Health - Occupational Stress Questionnaire    Feeling of Stress : Rather much  Social Connections: Unknown (04/06/2019)   Social Connection and Isolation Panel [NHANES]    Frequency of Communication with Friends and Family: Once a week    Frequency of Social Gatherings with Friends and Family: Never    Attends Religious Services: More than 4 times per year    Active Member of [employer] or Organizations: Not on file    Attends Banker Meetings: Not on file    Marital Status: Not on file  Intimate Partner Violence: Not on file      Review of Systems  Constitutional:  Negative for fatigue and fever.  HENT:  Negative for congestion, mouth sores and postnasal drip.   Respiratory:  Negative for cough.   Cardiovascular:  Negative for chest pain.  Genitourinary:  Negative for flank pain.  Psychiatric/Behavioral: Negative.      Vital Signs: BP (!) 143/85 (BP Location: Right Arm, Patient Position: Sitting, Cuff Size: Normal)   Pulse 67   Temp 98.6 F (37 C) (Oral)   Ht 5' 3.78" (1.62 m)   Wt 166 lb 9.6 oz  (75.6 kg)   LMP 07/01/2013   SpO2 98%   BMI 28.79 kg/m    Physical Exam Constitutional:      Appearance: Normal appearance.  HENT:     Head: Normocephalic and atraumatic.     Nose: Nose normal.     Mouth/Throat:     Mouth: Mucous membranes are moist.     Pharynx: No posterior oropharyngeal erythema.  Eyes:     Extraocular Movements: Extraocular movements intact.     Pupils: Pupils are equal, round, and reactive to light.  Cardiovascular:     Pulses: Normal pulses.     Heart sounds: Normal heart sounds.  Pulmonary:     Effort: Pulmonary effort is normal.     Breath sounds: Normal breath sounds.  Neurological:     General: No focal deficit present.     Mental Status: She is alert.  Psychiatric:        Mood and Affect: Mood normal.        Behavior: Behavior normal.        Assessment/Plan: 1. Inadequately controlled diabetes mellitus (HCC) Restart all medications as before  - Insulin Pen Needle (NOVOFINE PEN NEEDLE) 32G X 6 MM MISC; Use with insulin once or 2 x day  Dispense: 100 each; Refill: 3  2. Mixed hyperlipidemia Cv protection  - atorvastatin (LIPITOR) 10 MG tablet; Take 1 tablet (10 mg total) by mouth daily.  Dispense: 90 tablet; Refill: 3 - losartan (COZAAR) 25 MG tablet; Take 1 tablet (25 mg total) by mouth once daily.  Dispense: 90 tablet; Refill: 3  3. Benign hypertension Refilled Losartan for nephroprotection    General Counseling: Barbara Henson verbalizes understanding of the findings of todays visit and agrees with plan of treatment. I have discussed any further diagnostic evaluation that may be needed or ordered today. We also reviewed her medications today. she has been encouraged to call the office with any questions or concerns that should arise related to todays visit.    No orders of the defined types were placed in this encounter.   Meds ordered this encounter  Medications   atorvastatin (LIPITOR) 10 MG tablet    Sig: Take 1 tablet (10 mg total) by  mouth daily.    Dispense:  90 tablet    Refill:  3   losartan (COZAAR) 25 MG tablet    Sig: Take 1 tablet (25 mg total) by mouth once daily.    Dispense:  90 tablet    Refill:  3    Total time spent:25 Minutes Time spent includes review of chart, medications, test results, and follow up plan with the patient.   [place] Controlled Substance Database was reviewed by me.   Dr Lyndon Code Internal medicine

## 2022-12-29 ENCOUNTER — Other Ambulatory Visit: Payer: Self-pay

## 2023-01-21 ENCOUNTER — Ambulatory Visit: Payer: Self-pay | Admitting: Internal Medicine

## 2023-02-03 ENCOUNTER — Telehealth: Payer: Self-pay

## 2023-02-04 ENCOUNTER — Ambulatory Visit: Payer: Self-pay | Admitting: Internal Medicine

## 2023-06-05 ENCOUNTER — Other Ambulatory Visit: Payer: Self-pay

## 2023-09-19 ENCOUNTER — Other Ambulatory Visit: Payer: Self-pay | Admitting: Internal Medicine

## 2023-09-19 DIAGNOSIS — E782 Mixed hyperlipidemia: Secondary | ICD-10-CM

## 2023-09-20 ENCOUNTER — Other Ambulatory Visit: Payer: Self-pay

## 2023-09-20 DIAGNOSIS — E782 Mixed hyperlipidemia: Secondary | ICD-10-CM

## 2023-09-20 MED ORDER — LOSARTAN POTASSIUM 25 MG PO TABS
25.0000 mg | ORAL_TABLET | Freq: Every day | ORAL | 3 refills | Status: DC
Start: 2023-09-20 — End: 2023-10-04
  Filled 2023-09-20: qty 90, 90d supply, fill #0

## 2023-10-04 ENCOUNTER — Other Ambulatory Visit: Payer: Self-pay

## 2023-10-04 DIAGNOSIS — E782 Mixed hyperlipidemia: Secondary | ICD-10-CM

## 2023-10-04 MED ORDER — LOSARTAN POTASSIUM 25 MG PO TABS
25.0000 mg | ORAL_TABLET | Freq: Every day | ORAL | 3 refills | Status: AC
Start: 2023-10-04 — End: ?
